# Patient Record
Sex: Male | Born: 1970
Health system: Southern US, Community
[De-identification: ages and names within clinical notes are randomized; demographics above are authoritative.]

---

## 2008-08-04 ENCOUNTER — Emergency Department (HOSPITAL_BASED_OUTPATIENT_CLINIC_OR_DEPARTMENT_OTHER): Admission: EM | Admit: 2008-08-04 | Discharge: 2008-08-04 | Payer: Self-pay | Admitting: Emergency Medicine

## 2016-02-19 ENCOUNTER — Encounter (HOSPITAL_BASED_OUTPATIENT_CLINIC_OR_DEPARTMENT_OTHER): Payer: Self-pay | Admitting: Emergency Medicine

## 2016-02-19 ENCOUNTER — Emergency Department (HOSPITAL_BASED_OUTPATIENT_CLINIC_OR_DEPARTMENT_OTHER)
Admission: EM | Admit: 2016-02-19 | Discharge: 2016-02-19 | Disposition: A | Discharge status: Home / Self Care | Payer: Self-pay | Attending: Emergency Medicine | Admitting: Emergency Medicine

## 2016-02-19 DIAGNOSIS — Y939 Activity, unspecified: Secondary | ICD-10-CM | POA: Insufficient documentation

## 2016-02-19 DIAGNOSIS — Z23 Encounter for immunization: Secondary | ICD-10-CM | POA: Insufficient documentation

## 2016-02-19 DIAGNOSIS — Y999 Unspecified external cause status: Secondary | ICD-10-CM | POA: Insufficient documentation

## 2016-02-19 DIAGNOSIS — W228XXA Striking against or struck by other objects, initial encounter: Secondary | ICD-10-CM | POA: Insufficient documentation

## 2016-02-19 DIAGNOSIS — S0181XA Laceration without foreign body of other part of head, initial encounter: Secondary | ICD-10-CM

## 2016-02-19 DIAGNOSIS — S01511A Laceration without foreign body of lip, initial encounter: Secondary | ICD-10-CM | POA: Insufficient documentation

## 2016-02-19 DIAGNOSIS — Y929 Unspecified place or not applicable: Secondary | ICD-10-CM | POA: Insufficient documentation

## 2016-02-19 MED ORDER — LIDOCAINE HCL (PF) 1 % IJ SOLN
10.0000 mL | Freq: Once | INTRAMUSCULAR | Status: AC
  Administered 2016-02-19: 5 mL

## 2016-02-19 MED ORDER — BACITRACIN ZINC 500 UNIT/GM EX OINT: 1.0000 "application " | TOPICAL_OINTMENT | Freq: Two times a day (BID) | CUTANEOUS | 0 refills | Status: AC

## 2016-02-19 MED ORDER — TETANUS-DIPHTHERIA TOXOIDS TD 5-2 LFU IM INJ: 0.5000 mL | INJECTION | Freq: Once | INTRAMUSCULAR | Status: DC

## 2016-02-19 MED ORDER — LIDOCAINE HCL (PF) 1 % IJ SOLN
INTRAMUSCULAR | Status: AC
  Administered 2016-02-19: 5 mL
  Filled 2016-02-19: qty 10

## 2016-02-19 MED ORDER — TETANUS-DIPHTH-ACELL PERTUSSIS 5-2.5-18.5 LF-MCG/0.5 IM SUSP
0.5000 mL | Freq: Once | INTRAMUSCULAR | Status: AC
  Administered 2016-02-19: 0.5 mL via INTRAMUSCULAR
  Filled 2016-02-19: qty 0.5

## 2016-02-19 MED ORDER — LIDOCAINE-EPINEPHRINE (PF) 2 %-1:200000 IJ SOLN: 10.0000 mL | Freq: Once | INTRAMUSCULAR | Status: DC

## 2016-02-19 NOTE — ED Notes (Signed)
Pt given Rx x 1. Wound care and s/s of infection discussed. Advised to establish care with a PCP and to have b/p rechecked this week. BP readings given to pt for f/u
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
## 2016-02-19 NOTE — Discharge Instructions (Signed)
Your stitches are absorbable so they do not need to be removed. Return to the emergency department if he develops any signs of infection or problems with your wounds. Use half hydrogen peroxide and half water to clean your wounds twice daily. Apply antibiotic ointment twice daily.
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
## 2018-11-21 ENCOUNTER — Other Ambulatory Visit: Payer: Self-pay

## 2018-11-21 ENCOUNTER — Observation Stay (HOSPITAL_BASED_OUTPATIENT_CLINIC_OR_DEPARTMENT_OTHER): Payer: Self-pay

## 2018-11-21 ENCOUNTER — Encounter (HOSPITAL_BASED_OUTPATIENT_CLINIC_OR_DEPARTMENT_OTHER): Payer: Self-pay | Admitting: Emergency Medicine

## 2018-11-21 ENCOUNTER — Emergency Department (HOSPITAL_BASED_OUTPATIENT_CLINIC_OR_DEPARTMENT_OTHER): Payer: Self-pay

## 2018-11-21 ENCOUNTER — Inpatient Hospital Stay (HOSPITAL_BASED_OUTPATIENT_CLINIC_OR_DEPARTMENT_OTHER)
Admission: EM | Admit: 2018-11-21 | Discharge: 2018-11-23 | DRG: 312 | Disposition: A | Discharge status: Home / Self Care | Payer: Self-pay | Attending: Family Medicine | Admitting: Family Medicine

## 2018-11-21 DIAGNOSIS — R7303 Prediabetes: Secondary | ICD-10-CM | POA: Diagnosis present

## 2018-11-21 DIAGNOSIS — M25512 Pain in left shoulder: Secondary | ICD-10-CM | POA: Diagnosis present

## 2018-11-21 DIAGNOSIS — R55 Syncope and collapse: Principal | ICD-10-CM | POA: Diagnosis present

## 2018-11-21 DIAGNOSIS — R6884 Jaw pain: Secondary | ICD-10-CM | POA: Diagnosis present

## 2018-11-21 DIAGNOSIS — Z20828 Contact with and (suspected) exposure to other viral communicable diseases: Secondary | ICD-10-CM | POA: Diagnosis present

## 2018-11-21 DIAGNOSIS — K029 Dental caries, unspecified: Secondary | ICD-10-CM | POA: Diagnosis present

## 2018-11-21 DIAGNOSIS — I10 Essential (primary) hypertension: Secondary | ICD-10-CM | POA: Diagnosis present

## 2018-11-21 DIAGNOSIS — R7989 Other specified abnormal findings of blood chemistry: Secondary | ICD-10-CM | POA: Diagnosis present

## 2018-11-21 DIAGNOSIS — K0889 Other specified disorders of teeth and supporting structures: Secondary | ICD-10-CM | POA: Diagnosis present

## 2018-11-21 DIAGNOSIS — K047 Periapical abscess without sinus: Secondary | ICD-10-CM

## 2018-11-21 DIAGNOSIS — Z803 Family history of malignant neoplasm of breast: Secondary | ICD-10-CM

## 2018-11-21 DIAGNOSIS — R778 Other specified abnormalities of plasma proteins: Secondary | ICD-10-CM | POA: Diagnosis present

## 2018-11-21 DIAGNOSIS — Z79899 Other long term (current) drug therapy: Secondary | ICD-10-CM

## 2018-11-21 DIAGNOSIS — E669 Obesity, unspecified: Secondary | ICD-10-CM | POA: Diagnosis present

## 2018-11-21 DIAGNOSIS — Z6835 Body mass index (BMI) 35.0-35.9, adult: Secondary | ICD-10-CM

## 2018-11-21 DIAGNOSIS — Z833 Family history of diabetes mellitus: Secondary | ICD-10-CM

## 2018-11-21 DIAGNOSIS — R9431 Abnormal electrocardiogram [ECG] [EKG]: Secondary | ICD-10-CM | POA: Diagnosis present

## 2018-11-21 DIAGNOSIS — R110 Nausea: Secondary | ICD-10-CM | POA: Diagnosis present

## 2018-11-21 LAB — CBC WITH DIFFERENTIAL/PLATELET
Abs Immature Granulocytes: 0.06 10*3/uL (ref 0.00–0.07)
Basophils Absolute: 0 10*3/uL (ref 0.0–0.1)
Basophils Relative: 0 %
Eosinophils Absolute: 0 10*3/uL (ref 0.0–0.5)
Eosinophils Relative: 0 %
HCT: 38.6 % — ABNORMAL LOW (ref 39.0–52.0)
Hemoglobin: 11.1 g/dL — ABNORMAL LOW (ref 13.0–17.0)
Immature Granulocytes: 0 %
Lymphocytes Relative: 5 %
Lymphs Abs: 0.7 10*3/uL (ref 0.7–4.0)
MCH: 22.2 pg — ABNORMAL LOW (ref 26.0–34.0)
MCHC: 28.8 g/dL — ABNORMAL LOW (ref 30.0–36.0)
MCV: 77.4 fL — ABNORMAL LOW (ref 80.0–100.0)
Monocytes Absolute: 0.6 10*3/uL (ref 0.1–1.0)
Monocytes Relative: 4 %
Neutro Abs: 14.4 10*3/uL — ABNORMAL HIGH (ref 1.7–7.7)
Neutrophils Relative %: 91 %
Platelets: 276 10*3/uL (ref 150–400)
RBC: 4.99 MIL/uL (ref 4.22–5.81)
RDW: 18.8 % — ABNORMAL HIGH (ref 11.5–15.5)
WBC: 15.8 10*3/uL — ABNORMAL HIGH (ref 4.0–10.5)
nRBC: 0 % (ref 0.0–0.2)

## 2018-11-21 LAB — COMPREHENSIVE METABOLIC PANEL
ALT: 20 U/L (ref 0–44)
AST: 18 U/L (ref 15–41)
Albumin: 3.5 g/dL (ref 3.5–5.0)
Alkaline Phosphatase: 37 U/L — ABNORMAL LOW (ref 38–126)
Anion gap: 8 (ref 5–15)
BUN: 40 mg/dL — ABNORMAL HIGH (ref 6–20)
CO2: 23 mmol/L (ref 22–32)
Calcium: 9.1 mg/dL (ref 8.9–10.3)
Chloride: 107 mmol/L (ref 98–111)
Creatinine, Ser: 0.93 mg/dL (ref 0.61–1.24)
GFR calc Af Amer: >60 mL/min (ref >60)
GFR calc non Af Amer: >60 mL/min (ref >60)
Glucose, Bld: 186 mg/dL — ABNORMAL HIGH (ref 70–99)
Potassium: 4.5 mmol/L (ref 3.5–5.1)
Sodium: 138 mmol/L (ref 135–145)
Total Bilirubin: 0.5 mg/dL (ref 0.3–1.2)
Total Protein: 6.5 g/dL (ref 6.5–8.1)

## 2018-11-21 LAB — CBG MONITORING, ED: Glucose-Capillary: 174 mg/dL — ABNORMAL HIGH (ref 70–99)

## 2018-11-21 LAB — RAPID URINE DRUG SCREEN, HOSP PERFORMED
Amphetamines: NOT DETECTED
Barbiturates: NOT DETECTED
Benzodiazepines: NOT DETECTED
Cocaine: NOT DETECTED
Opiates: NOT DETECTED
Tetrahydrocannabinol: POSITIVE — AB

## 2018-11-21 LAB — TROPONIN I (HIGH SENSITIVITY)
Troponin I (High Sensitivity): 51 ng/L — ABNORMAL HIGH (ref <18)
Troponin I (High Sensitivity): 66 ng/L — ABNORMAL HIGH (ref <18)

## 2018-11-21 LAB — D-DIMER, QUANTITATIVE: D-Dimer, Quant: 0.79 ug/mL-FEU — ABNORMAL HIGH (ref 0.00–0.50)

## 2018-11-21 LAB — LACTIC ACID, PLASMA: Lactic Acid, Venous: 1.5 mmol/L (ref 0.5–1.9)

## 2018-11-21 MED ORDER — SODIUM CHLORIDE 0.9 % IV SOLN
INTRAVENOUS | Status: DC | PRN
  Administered 2018-11-21: 1000 mL via INTRAVENOUS

## 2018-11-21 MED ORDER — SODIUM CHLORIDE 0.9 % IV SOLN: Freq: Once | INTRAVENOUS | Status: DC

## 2018-11-21 MED ORDER — SODIUM CHLORIDE 0.9 % IV BOLUS: 1000.0000 mL | Freq: Once | INTRAVENOUS | Status: DC

## 2018-11-21 MED ORDER — SODIUM CHLORIDE 0.9 % IV BOLUS
1000.0000 mL | Freq: Once | INTRAVENOUS | Status: AC
  Administered 2018-11-21: 19:00:00 1000 mL via INTRAVENOUS

## 2018-11-21 MED ORDER — ASPIRIN 81 MG PO CHEW
324.0000 mg | CHEWABLE_TABLET | Freq: Once | ORAL | Status: AC
  Administered 2018-11-21: 324 mg via ORAL
  Filled 2018-11-21: qty 4

## 2018-11-21 MED ORDER — IOHEXOL 350 MG/ML SOLN
75.0000 mL | Freq: Once | INTRAVENOUS | Status: AC | PRN
  Administered 2018-11-21: 20:00:00 via INTRAVENOUS

## 2018-11-21 MED ORDER — CLINDAMYCIN PHOSPHATE 600 MG/50ML IV SOLN
INTRAVENOUS | Status: AC
  Filled 2018-11-21: qty 50

## 2018-11-21 MED ORDER — DEXTROSE 5 % IV SOLN
450.0000 mg | Freq: Once | INTRAVENOUS | Status: DC
  Filled 2018-11-21: qty 3

## 2018-11-21 MED ORDER — CLINDAMYCIN PHOSPHATE 300 MG/50ML IV SOLN
300.0000 mg | Freq: Once | INTRAVENOUS | Status: AC
  Administered 2018-11-21: 300 mg via INTRAVENOUS
  Filled 2018-11-21: qty 50

## 2018-11-21 NOTE — ED Notes (Signed)
ED Provider at bedside. 

## 2018-11-21 NOTE — ED Notes (Signed)
Received report

## 2018-11-21 NOTE — ED Triage Notes (Signed)
Pt reports left upper tooth pain x 4 days that pt states resolved this am. Pt reports taking 1g amoxicillin x 4 days from prior script and pt now c/o left side facial redness and swelling. Pt denies sob, denies fever

## 2018-11-21 NOTE — ED Notes (Signed)
[facility] notified (Tammy) - patient ready for transport 

## 2018-11-21 NOTE — ED Notes (Signed)
cbg 174 ° °

## 2018-11-21 NOTE — ED Notes (Signed)
Spoke with Carol in lab to add on trop 

## 2018-11-21 NOTE — ED Notes (Addendum)
Pt reports feeling lightheaded and diaphoretic x 1 hr pta. Pt states significant other states pt "passed out". Pt denies HA. Pt also reports 2 episodes of emesis. Pt was seen by EMS and advised to report to ED

## 2018-11-21 NOTE — ED Provider Notes (Signed)
[facility] EM[facility]GENCY DEPARTMENT Provider Note   CSN: [id number] Arrival date & time: 11/21/18  1741     History   Chief Complaint Chief Complaint  Patient presents with   Loss of Consciousness   Facial Swelling    HPI Carl Barron is a 48 y.o. male with no significant past medical history who presents today for evaluation of 2 complaints. 1.  Dental pain: He reports that over the past 4 days he has had pain in the left upper side of his face from a tooth.  He states that he has been treating this at home with leftover amoxicillin and Keflex.  He reports that today when he woke up he had swelling on the left side of his face that over the course of the day has progressed from a quarter sized area up to swelling from the lower lid down.  He denies any swelling below the mandible.  No changes to phonation, difficulty swallowing, or feelings of intraoral swelling.  He attempted to see a dentist today however they reported that they were unable to see him.  2.  Syncope: According to patient and his girlfriend today patient got up to brush his teeth to go to get medical care when he started feeling poorly.  He called her into the room when he had a 1 to 2-second syncopal event.  This was witnessed by his girlfriend who states that he did not strike his head.  He did not have any seizure-like activity.  He denies any chest pain, shortness of breath.  He did vomit 2 times around when he passed out.  While in the emergency room, shortly after his IV start he again started to feel like he was going to pass out.  He was nauseous however did not vomit.  He continued to denied any pain in his chest, abdomen or anywhere other than related to his tooth.  He denied any shortness of breath.  This resolved without specific intervention.  During this he was diaphoretic and reported feeling lightheaded.  He states that he has not had blood drawn in 20 years.     HPI  History reviewed. No  pertinent past medical history.  Patient Active Problem List   Diagnosis Date Noted   Abnormal EKG 11/21/2018    History reviewed. No pertinent surgical history.      Home Medications    Prior to Admission medications   Medication Sig Start Date End Date Taking? Authorizing Provider  bacitracin ointment Apply 1 application topically 2 (two) times daily. 02/19/16   Charlesetta Shanks, MD    Family History History reviewed. No pertinent family history.  Social History Social History   Tobacco Use   Smoking status: Never Smoker   Smokeless tobacco: Never Used  Substance Use Topics   Alcohol use: Never    Frequency: Never   Drug use: Never     Allergies   Patient has no known allergies.   Review of Systems Review of Systems  Constitutional: Positive for diaphoresis and fatigue. Negative for chills and fever.  HENT: Positive for dental problem and facial swelling. Negative for congestion, drooling, rhinorrhea, sinus pain, sore throat, trouble swallowing and voice change.   Respiratory: Negative for cough and chest tightness.   Cardiovascular: Negative for chest pain and palpitations.  Gastrointestinal: Positive for nausea. Negative for abdominal pain, anal bleeding, diarrhea and vomiting.  Genitourinary: Negative for dysuria.  Musculoskeletal: Negative for back pain and neck pain.  Skin: Negative for  color change and rash.  Neurological: Positive for syncope. Negative for speech difficulty.  Psychiatric/Behavioral: Negative for confusion.  All other systems reviewed and are negative.    Physical Exam Updated Vital Signs BP (!) 158/98    Pulse (!) 108    Temp 98.5 F (36.9 C) (Oral)    Resp (!) 21    Ht  (1.676 m)    Wt 101.2 kg    SpO2 100%    BMI 35.99 kg/m   Physical Exam Vitals signs and nursing note reviewed.  Constitutional:      Appearance: He is well-developed.     Comments: On initial evaluation he was diaphoretic, this resolved while I was in  the room.  HENT:     Head: Atraumatic.     Comments: Mild edema present over the left-sided face.  There is no appreciable swelling under the mandible.  No elevation of the floor of the mouth.  There is no visualized intraoral abscess or swelling.    Mouth/Throat:     Mouth: Mucous membranes are moist.     Pharynx: Oropharynx is clear. No oropharyngeal exudate or posterior oropharyngeal erythema.     Comments: Poor state of dentition with missing/carious teeth. Eyes:     Conjunctiva/sclera: Conjunctivae normal.  Neck:     Musculoskeletal: Normal range of motion and neck supple. No neck rigidity or muscular tenderness.  Cardiovascular:     Rate and Rhythm: Regular rhythm. Tachycardia present.     Pulses: Normal pulses.     Heart sounds: Normal heart sounds. No murmur.  Pulmonary:     Effort: Pulmonary effort is normal. No respiratory distress.     Breath sounds: Normal breath sounds. No stridor.  Abdominal:     General: Abdomen is flat. There is no distension.     Palpations: Abdomen is soft.     Tenderness: There is no abdominal tenderness.  Musculoskeletal: Normal range of motion.  Lymphadenopathy:     Cervical: No cervical adenopathy.  Skin:    General: Skin is warm and dry.  Neurological:     General: No focal deficit present.     Mental Status: He is alert.  Psychiatric:        Mood and Affect: Mood normal.        Behavior: Behavior normal.      ED Treatments / Results  Labs (all labs ordered are listed, but only abnormal results are displayed) Labs Reviewed  COMPREHENSIVE METABOLIC PANEL - Abnormal; Notable for the following components:      Result Value   Glucose, Bld 186 (*)    BUN 40 (*)    Alkaline Phosphatase 37 (*)    All other components within normal limits  CBC WITH DIFFERENTIAL/PLATELET - Abnormal; Notable for the following components:   WBC 15.8 (*)    Hemoglobin 11.1 (*)    HCT 38.6 (*)    MCV 77.4 (*)    MCH 22.2 (*)    MCHC 28.8 (*)    RDW  18.8 (*)    Neutro Abs 14.4 (*)    All other components within normal limits  D-DIMER, QUANTITATIVE (NOT AT [facility]) - Abnormal; Notable for the following components:   D-Dimer, Quant 0.79 (*)    All other components within normal limits  RAPID URINE DRUG SCREEN, HOSP PERFORMED - Abnormal; Notable for the following components:   Tetrahydrocannabinol POSITIVE (*)    All other components within normal limits  CBG MONITORING, ED - Abnormal; Notable  for the following components:   Glucose-Capillary 174 (*)    All other components within normal limits  TROPONIN I (HIGH SENSITIVITY) - Abnormal; Notable for the following components:   Troponin I (High Sensitivity) 51 (*)    All other components within normal limits  TROPONIN I (HIGH SENSITIVITY) - Abnormal; Notable for the following components:   Troponin I (High Sensitivity) 66 (*)    All other components within normal limits  CULTURE, BLOOD (ROUTINE X 2)  CULTURE, BLOOD (ROUTINE X 2)  SARS CORONAVIRUS 2 (TAT 6-24 HRS)  LACTIC ACID, PLASMA  LACTIC ACID, PLASMA    EKG EKG Interpretation  Date/Time:  Saturday November 21 2018 18:27:15 EDT Ventricular Rate:  97 PR Interval:    QRS Duration: 86 QT Interval:  338 QTC Calculation: 430 R Axis:   32 Text Interpretation: Sinus rhythm T wave inversion in inferior leads, same as prior ecg on 11/21/2018 Confirmed by Marianna Fussykstra, Richard ([id number]) on 11/21/2018 6:41:38 PM   EKG Interpretation  Date/Time:  Saturday November 21 2018 19:21:43 EDT Ventricular Rate:  105 PR Interval:    QRS Duration: 89 QT Interval:  328 QTC Calculation: 434 R Axis:   55 Text Interpretation: Sinus tachycardia T wave inversions unchanged from prior ecg no acute STEMI Confirmed by Marianna Fussykstra, Richard ([id number]) on 11/21/2018 7:42:11 PM        Radiology Ct Head Wo Contrast  Result Date: 11/21/2018 CLINICAL DATA:  Facial swelling. EXAM: CT HEAD WITHOUT CONTRAST CT MAXILLOFACIAL WITHOUT CONTRAST TECHNIQUE: Multidetector CT  imaging of the head and maxillofacial structures were performed using the standard protocol without intravenous contrast. Multiplanar CT image reconstructions of the maxillofacial structures were also generated. COMPARISON:  None. FINDINGS: CT HEAD FINDINGS Brain: No evidence of acute infarction, hemorrhage, hydrocephalus, extra-axial collection or mass lesion/mass effect. Vascular: No hyperdense vessel or unexpected calcification. Skull: Normal. Negative for fracture or focal lesion. Other: None. CT MAXILLOFACIAL FINDINGS Osseous: No fracture or mandibular dislocation. No destructive process. Orbits: Negative. No traumatic or inflammatory finding. Sinuses: There is mild bilateral maxillary mucosal thickening. The rest of the paranasal sinuses and mastoid air cells are essentially clear. Soft tissues: There is left pre maxillary soft tissue swelling. There is no abscess. Multiple dental caries are noted. IMPRESSION: 1. No acute intracranial abnormality. 2. There is left-sided pre maxillary soft tissue swelling without evidence for an associated abscess. 3. Multiple dental caries are noted. Electronically Signed   By: Katherine Mantlehristopher  Green M.D.   On: 11/21/2018 22:19   Ct Angio Chest Pe W/cm &/or Wo Cm  Result Date: 11/21/2018 CLINICAL DATA:  Suspected PE. Positive D-dimer. Syncope. Tachycardia. EXAM: CT ANGIOGRAPHY CHEST WITH CONTRAST TECHNIQUE: Multidetector CT imaging of the chest was performed using the standard protocol during bolus administration of intravenous contrast. Multiplanar CT image reconstructions and MIPs were obtained to evaluate the vascular anatomy. CONTRAST:  75 mL of Omnipaque 350 COMPARISON:  None. FINDINGS: Cardiovascular: The thoracic aorta is normal without aneurysm or dissection. No atherosclerotic change identified. The heart size is unremarkable. No obvious coronary artery calcifications are identified. Evaluation of pulmonary arteries is somewhat limited due to respiratory motion.  Within these limitations, no pulmonary emboli identified. Mediastinum/Nodes: No enlarged mediastinal, hilar, or axillary lymph nodes. Thyroid gland, trachea, and esophagus demonstrate no significant findings. Lungs/Pleura: Central airways are normal. No pneumothorax. No nodules, masses, or focal infiltrates. Upper Abdomen: No acute abnormality. Musculoskeletal: No chest wall abnormality. No acute or significant osseous findings. Review of the MIP images confirms the above findings. IMPRESSION:  1. Evaluation for pulmonary emboli is somewhat limited due to respiratory motion. Within these limitations, no central emboli identified. 2. No other acute abnormalities are noted. Electronically Signed   By: Gerome Sam III M.D   On: 11/21/2018 20:56   Ct Maxillofacial Wo Cm  Result Date: 11/21/2018 CLINICAL DATA:  Facial swelling. EXAM: CT HEAD WITHOUT CONTRAST CT MAXILLOFACIAL WITHOUT CONTRAST TECHNIQUE: Multidetector CT imaging of the head and maxillofacial structures were performed using the standard protocol without intravenous contrast. Multiplanar CT image reconstructions of the maxillofacial structures were also generated. COMPARISON:  None. FINDINGS: CT HEAD FINDINGS Brain: No evidence of acute infarction, hemorrhage, hydrocephalus, extra-axial collection or mass lesion/mass effect. Vascular: No hyperdense vessel or unexpected calcification. Skull: Normal. Negative for fracture or focal lesion. Other: None. CT MAXILLOFACIAL FINDINGS Osseous: No fracture or mandibular dislocation. No destructive process. Orbits: Negative. No traumatic or inflammatory finding. Sinuses: There is mild bilateral maxillary mucosal thickening. The rest of the paranasal sinuses and mastoid air cells are essentially clear. Soft tissues: There is left pre maxillary soft tissue swelling. There is no abscess. Multiple dental caries are noted. IMPRESSION: 1. No acute intracranial abnormality. 2. There is left-sided pre maxillary soft  tissue swelling without evidence for an associated abscess. 3. Multiple dental caries are noted. Electronically Signed   By: Katherine Mantle M.D.   On: 11/21/2018 22:19    Procedures Procedures (including critical care time)  Medications Ordered in ED Medications  clindamycin (CLEOCIN) IVPB 300 mg (300 mg Intravenous New Bag/Given 11/21/18 2243)  0.9 %  sodium chloride infusion ( Intravenous Stopped 11/21/18 2242)  sodium chloride 0.9 % bolus 1,000 mL (0 mLs Intravenous Stopped 11/21/18 2141)  iohexol (OMNIPAQUE) 350 MG/ML injection 75 mL ( Intravenous Contrast Given 11/21/18 2020)  aspirin chewable tablet 324 mg (324 mg Oral Given 11/21/18 2234)     Initial Impression / Assessment and Plan / ED Course  I have reviewed the triage vital signs and the nursing notes.  Pertinent labs & imaging results that were available during my care of the patient were reviewed by me and considered in my medical decision making (see chart for details).  Clinical Course as of Nov 20 2298  Sat Nov 21, 2018  1908 Troponin I (High Sensitivity)(!): 51 [EH]  [phone] Updated patient and girl friend on plan.    [EH]    Clinical Course User Index [EH] Cristina Gong, PA-C      Sharalyn Ink presents today for evaluation of multiple complaints. 1.  Syncope: He reportedly had a syncopal episode at home followed by a presyncopal episode here in the emergency room.  He is not having any chest pain or shortness of breath, however with syncope EKG was obtained showing multiple T wave inversions.  Troponin was obtained and was elevated at 51.  Patient is not having chest pain or shortness of breath.  He was consistently tachycardic while in the emergency room with heart rates between 100-110.  D-dimer was elevated.  CTA PE study was obtained without evidence of PE or other acute intrathoracic abnormalities.  He was observed on cardiac monitoring without significant arrhythmia.  He does not have a primary care  doctor or any history of cardiac problems.    2. Dental infection: He has had 4 days of dental pain.  He has been treating this at home with leftover amoxicillin and Keflex however today woke up with facial swelling.  Patient is tachycardic.  Labs obtained and reviewed, He has a leukocytosis  at 15.8.  Blood cultures were ordered.  His lactic acid is not elevated and he is afebrile.  He was started on IV clindamycin and given a fluid bolus.  He did not require 30/kg fluid bolus as his lactic was not elevated over 4 and he was not hypotensive.  After discussion with hospitalist who requested CT head and max face for further evaluation of the possible abscess.  Coronavirus testing was sent.  Given elevated troponin without prior for comparison and multiple T wave inversions will admit patient to [facility] for rule out ACS.  Patient remained hemodynamically stable while in my care.  This patient was seen as a shared visit with Dr. Stevie Kern.  I spoke with Dr. Debby Bud who agreed to admit the patient.  Patient will be transported by [facility].  Final Clinical Impressions(s) / ED Diagnoses   Final diagnoses:  Elevated troponin  Syncope and collapse  Dental infection    ED Discharge Orders    None       Norman Clay 11/21/18 2311    Milagros Loll, MD 11/23/18 1247

## 2018-11-22 ENCOUNTER — Encounter (HOSPITAL_COMMUNITY): Payer: Self-pay | Admitting: Internal Medicine

## 2018-11-22 DIAGNOSIS — R9431 Abnormal electrocardiogram [ECG] [EKG]: Secondary | ICD-10-CM

## 2018-11-22 DIAGNOSIS — R778 Other specified abnormalities of plasma proteins: Secondary | ICD-10-CM

## 2018-11-22 DIAGNOSIS — K047 Periapical abscess without sinus: Secondary | ICD-10-CM | POA: Diagnosis present

## 2018-11-22 DIAGNOSIS — R55 Syncope and collapse: Secondary | ICD-10-CM | POA: Diagnosis present

## 2018-11-22 LAB — LIPID PANEL
Cholesterol: 140 mg/dL (ref 0–200)
HDL: 44 mg/dL (ref >40)
LDL Cholesterol: 76 mg/dL (ref 0–99)
Total CHOL/HDL Ratio: 3.2 RATIO
Triglycerides: 99 mg/dL (ref <150)
VLDL: 20 mg/dL (ref 0–40)

## 2018-11-22 LAB — CBC WITH DIFFERENTIAL/PLATELET
Abs Immature Granulocytes: 0.06 10*3/uL (ref 0.00–0.07)
Basophils Absolute: 0.1 10*3/uL (ref 0.0–0.1)
Basophils Relative: 1 %
Eosinophils Absolute: 0.4 10*3/uL (ref 0.0–0.5)
Eosinophils Relative: 2 %
HCT: 34 % — ABNORMAL LOW (ref 39.0–52.0)
Hemoglobin: 10.2 g/dL — ABNORMAL LOW (ref 13.0–17.0)
Immature Granulocytes: 0 %
Lymphocytes Relative: 17 %
Lymphs Abs: 2.7 10*3/uL (ref 0.7–4.0)
MCH: 22.7 pg — ABNORMAL LOW (ref 26.0–34.0)
MCHC: 30 g/dL (ref 30.0–36.0)
MCV: 75.7 fL — ABNORMAL LOW (ref 80.0–100.0)
Monocytes Absolute: 1.4 10*3/uL — ABNORMAL HIGH (ref 0.1–1.0)
Monocytes Relative: 9 %
Neutro Abs: 10.9 10*3/uL — ABNORMAL HIGH (ref 1.7–7.7)
Neutrophils Relative %: 71 %
Platelets: 262 10*3/uL (ref 150–400)
RBC: 4.49 MIL/uL (ref 4.22–5.81)
RDW: 18.8 % — ABNORMAL HIGH (ref 11.5–15.5)
WBC: 15.5 10*3/uL — ABNORMAL HIGH (ref 4.0–10.5)
nRBC: 0 % (ref 0.0–0.2)

## 2018-11-22 LAB — SARS CORONAVIRUS 2 (TAT 6-24 HRS): SARS Coronavirus 2: NEGATIVE

## 2018-11-22 LAB — CK TOTAL AND CKMB (NOT AT ARMC)
CK, MB: 2.3 ng/mL (ref 0.5–5.0)
CK, MB: 1.8 ng/mL (ref 0.5–5.0)
Relative Index: 2 (ref 0.0–2.5)
Relative Index: 1.6 (ref 0.0–2.5)
Total CK: 117 U/L (ref 49–397)
Total CK: 111 U/L (ref 49–397)

## 2018-11-22 LAB — HEMOGLOBIN A1C
Hgb A1c MFr Bld: 6.4 % — ABNORMAL HIGH (ref 4.8–5.6)
Mean Plasma Glucose: 136.98 mg/dL

## 2018-11-22 LAB — GLUCOSE, CAPILLARY
Glucose-Capillary: 98 mg/dL (ref 70–99)
Glucose-Capillary: 109 mg/dL — ABNORMAL HIGH (ref 70–99)
Glucose-Capillary: 109 mg/dL — ABNORMAL HIGH (ref 70–99)

## 2018-11-22 LAB — TROPONIN I (HIGH SENSITIVITY)
Troponin I (High Sensitivity): 24 ng/L — ABNORMAL HIGH (ref <18)
Troponin I (High Sensitivity): 21 ng/L — ABNORMAL HIGH (ref <18)

## 2018-11-22 LAB — HIV ANTIBODY (ROUTINE TESTING W REFLEX): HIV Screen 4th Generation wRfx: NONREACTIVE

## 2018-11-22 MED ORDER — SODIUM CHLORIDE 0.9% FLUSH: 3.0000 mL | INTRAVENOUS | Status: DC | PRN

## 2018-11-22 MED ORDER — INSULIN ASPART 100 UNIT/ML ~~LOC~~ SOLN: 0.0000 [IU] | Freq: Every day | SUBCUTANEOUS | Status: DC

## 2018-11-22 MED ORDER — ACETAMINOPHEN 500 MG PO TABS
ORAL_TABLET | ORAL | Status: AC
  Administered 2018-11-22: 01:00:00 via ORAL
  Filled 2018-11-22: qty 2

## 2018-11-22 MED ORDER — SODIUM CHLORIDE 0.9% FLUSH
3.0000 mL | Freq: Two times a day (BID) | INTRAVENOUS | Status: DC
  Administered 2018-11-23: 3 mL via INTRAVENOUS

## 2018-11-22 MED ORDER — ASPIRIN EC 81 MG PO TBEC
81.0000 mg | DELAYED_RELEASE_TABLET | Freq: Every day | ORAL | Status: DC
  Administered 2018-11-22 – 2018-11-23 (×2): 81 mg via ORAL
  Filled 2018-11-22 (×3): qty 1

## 2018-11-22 MED ORDER — SODIUM CHLORIDE 0.9% FLUSH
3.0000 mL | Freq: Two times a day (BID) | INTRAVENOUS | Status: DC
  Administered 2018-11-22 – 2018-11-23 (×3): 3 mL via INTRAVENOUS

## 2018-11-22 MED ORDER — ACETAMINOPHEN 500 MG PO TABS
1000.0000 mg | ORAL_TABLET | Freq: Once | ORAL | Status: AC
  Administered 2018-11-22: 01:00:00 via ORAL

## 2018-11-22 MED ORDER — AMOXICILLIN-POT CLAVULANATE 875-125 MG PO TABS
1.0000 | ORAL_TABLET | Freq: Two times a day (BID) | ORAL | Status: AC
  Administered 2018-11-22 (×2): 1 via ORAL
  Filled 2018-11-22 (×2): qty 1

## 2018-11-22 MED ORDER — INSULIN ASPART 100 UNIT/ML ~~LOC~~ SOLN: 0.0000 [IU] | Freq: Three times a day (TID) | SUBCUTANEOUS | Status: DC

## 2018-11-22 MED ORDER — ENOXAPARIN SODIUM 40 MG/0.4ML ~~LOC~~ SOLN: 40.0000 mg | SUBCUTANEOUS | Status: DC

## 2018-11-22 MED ORDER — SODIUM CHLORIDE 0.9 % IV SOLN: 250.0000 mL | INTRAVENOUS | Status: DC | PRN

## 2018-11-22 MED ORDER — ACETAMINOPHEN 650 MG RE SUPP: 650.0000 mg | Freq: Four times a day (QID) | RECTAL | Status: DC | PRN

## 2018-11-22 MED ORDER — TRAMADOL HCL 50 MG PO TABS: 50.0000 mg | ORAL_TABLET | Freq: Four times a day (QID) | ORAL | Status: DC | PRN

## 2018-11-22 MED ORDER — ACETAMINOPHEN 325 MG PO TABS: 650.0000 mg | ORAL_TABLET | Freq: Four times a day (QID) | ORAL | Status: DC | PRN

## 2018-11-22 NOTE — ED Notes (Signed)
Verbal order for tylenol given during downtime d/t facial pain/swelling. See eMAR.

## 2018-11-22 NOTE — Progress Notes (Signed)
md round in room, new order noted

## 2018-11-22 NOTE — Plan of Care (Signed)

## 2018-11-22 NOTE — Progress Notes (Signed)
Pt arrived in room via ambulace stretcher, refused any pain, b/p elevate, rest in bed, notify provider, follow up new order, orient pt to room equipment and care plan, verbalized understanding,

## 2018-11-22 NOTE — H&P (Signed)
History and Physical    Carl Barron ZOX:[id number] DOB: Mar 22, 1970 DOA: 11/21/2018  PCP: Patient, No Pcp Per (Confirm with patient/family/NH records and if not entered, this has to be entered at [facility] point of entry) Patient coming from: Transfer from [facility], to [facility] from home  I have personally briefly reviewed patient's old medical records in [facility]  Chief Complaint: Shoulder pain  HPI: Carl Barron is a 48 y.o. male with medical history significant of good health with no prior medical problems.  Patient's had facial pain for several days in the left axilla and maxillary sinus.  He has had dental problems in the past.  The pain has been unremitting.  He denies any rigors or fevers.  Because of a presumed dental infection he presented to his [facility] ED seeking antibiotic therapy.  Carl Barron had had an episode of syncope witnessed by his wife.  ED Course: Patient was hemodynamically stable.  He had a second episode of syncope.  There was a rapid recovery.  There were no postictal symptoms.  Routine laboratory was drawn revealing a leukocytosis.  EKG performed revealed T wave inversions on 3 serial studies.  He had a flat T wave in lead I to aVF V4 and V6.  No other abnormalities were noted.  Patient did have troponin performed and came back elevated: #1 51, #2 66.  Patient has CT angio rule out PE which was negative.  Lungs were clear.  CT of the head and face was negative except for some premaxillary sinus swelling.  There was no abscess.  Patient was referred to [facility] for admission secondary to abnormal EKG and elevated troponins.  Review of Systems: As per HPI otherwise 10 point review of systems negative.  Patient admits to chronic rest of breath with exertion that has not been changed over the last several weeks   History reviewed. No pertinent past medical history.  History reviewed. No pertinent surgical history.   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  She has been married for 15 years.  He has no children.  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  They have their own home.   reports that he has never smoked. He has never used smokeless tobacco. He reports that he does not drink alcohol or use drugs.  No Known Allergies  Family History  Problem Relation Age of Onset   Breast cancer Mother    Diabetes Father      Prior to Admission medications   Medication Sig Start Date End Date Taking? Authorizing Provider  bacitracin ointment Apply 1 application topically 2 (two) times daily. 02/19/16   Arby Barrette, MD    Physical Exam: Vitals:   11/22/18 0100 11/22/18 0130 11/22/18 0300 11/22/18 0416  BP: (!) 142/91 128/75 114/71 (!) 158/107  Pulse: 93 95 92 94  Resp: Temp:    99 F (37.2 C)  TempSrc:    Oral  SpO2: 97% 95% 96% 100%  Weight:      Height:        Constitutional: NAD, calm, comfortable Vitals:   11/22/18 0100 11/22/18 0130 11/22/18 0300 11/22/18 0416  BP: (!) 142/91 128/75 114/71 (!) 158/107  Pulse: 93 95 92 94  Resp: Temp:    99 F (37.2 C)  TempSrc:    Oral  SpO2: 97% 95% 96% 100%  Weight:      Height:       General appearance: Overweight male in no distress  eyes: PERRL, lids and conjunctivae normal ENMT: Mucous membranes are moist. Posterior pharynx clear of any exudate or lesions.  No gingivitis.  Question of a tender molar.  Tender to palpation to the over the left maxillary sinus.  Neck: normal, supple, no masses, no thyromegaly Respiratory: clear to auscultation bilaterally, no wheezing, no crackles. Normal respiratory effort. No accessory muscle use.  Cardiovascular: Regular rate and rhythm, no murmurs / rubs / gallops. No extremity edema. 2+ pedal pulses. No carotid bruits.  Abdomen: Obese ,no tenderness, no masses palpated. No hepatosplenomegaly. Bowel sounds positive.  Musculoskeletal: no clubbing / cyanosis. No joint  deformity upper and lower extremities. Good ROM, no contractures. Normal muscle tone.  Skin: no rashes, lesions, ulcers. No induration Neurologic: CN 2-12 grossly intact. Sensation intact, DTR normal. Strength 5/5 in all 4.  Psychiatric: Normal judgment and insight. Alert and oriented x 3. Normal mood.     Labs on Admission: I have personally reviewed following labs and imaging studies  CBC: Recent Labs  Lab 11/21/18 1821  WBC 15.8*  NEUTROABS 14.4*  HGB 11.1*  HCT 38.6*  MCV 77.4*  PLT 276   Basic Metabolic Panel: Recent Labs  Lab 11/21/18 1821  NA 138  K 4.5  CL 107  CO2 23  GLUCOSE 186*  BUN 40*  CREATININE 0.93  CALCIUM 9.1   GFR: Estimated Creatinine Clearance: 110.6 mL/min (by C-G formula based on SCr of 0.93 mg/dL). Liver Function Tests: Recent Labs  Lab 11/21/18 1821  AST 18  ALT 20  ALKPHOS 37*  BILITOT 0.5  PROT 6.5  ALBUMIN 3.5   No results for input(s): LIPASE, AMYLASE in the last 168 hours. No results for input(s): AMMONIA in the last 168 hours. Coagulation Profile: No results for input(s): INR, PROTIME in the last 168 hours. Cardiac Enzymes: No results for input(s): CKTOTAL, CKMB, CKMBINDEX, TROPONINI in the last 168 hours. BNP (last 3 results) No results for input(s): PROBNP in the last 8760 hours. HbA1C: No results for input(s): HGBA1C in the last 72 hours. CBG: Recent Labs  Lab 11/21/18 1819  GLUCAP 174*   Lipid Profile: No results for input(s): CHOL, HDL, LDLCALC, TRIG, CHOLHDL, LDLDIRECT in the last 72 hours. Thyroid Function Tests: No results for input(s): TSH, T4TOTAL, FREET4, T3FREE, THYROIDAB in the last 72 hours. Anemia Panel: No results for input(s): VITAMINB12, FOLATE, FERRITIN, TIBC, IRON, RETICCTPCT in the last 72 hours. Urine analysis: No results found for: COLORURINE, APPEARANCEUR, LABSPEC, PHURINE, GLUCOSEU, HGBUR, BILIRUBINUR, KETONESUR, PROTEINUR, UROBILINOGEN, NITRITE, LEUKOCYTESUR  Radiological Exams on  Admission: Ct Head Wo Contrast  Result Date: 11/21/2018 CLINICAL DATA:  Facial swelling. EXAM: CT HEAD WITHOUT CONTRAST CT MAXILLOFACIAL WITHOUT CONTRAST TECHNIQUE: Multidetector CT imaging of the head and maxillofacial structures were performed using the standard protocol without intravenous contrast. Multiplanar CT image reconstructions of the maxillofacial structures were also generated. COMPARISON:  None. FINDINGS: CT HEAD FINDINGS Brain: No evidence of acute infarction, hemorrhage, hydrocephalus, extra-axial collection or mass lesion/mass effect. Vascular: No hyperdense vessel or unexpected calcification. Skull: Normal. Negative for fracture or focal lesion. Other: None. CT MAXILLOFACIAL FINDINGS Osseous: No fracture or mandibular dislocation. No destructive process. Orbits: Negative. No traumatic or inflammatory finding. Sinuses: There is mild bilateral maxillary mucosal thickening. The rest of the paranasal sinuses and mastoid air cells are essentially clear. Soft tissues: There is left pre maxillary soft tissue swelling. There is  no abscess. Multiple dental caries are noted. IMPRESSION: 1. No acute intracranial abnormality. 2. There is left-sided pre maxillary soft tissue swelling without evidence for an associated abscess. 3. Multiple dental caries are noted. Electronically Signed   By: Katherine Mantle M.D.   On: 11/21/2018 22:19   Ct Angio Chest Pe W/cm &/or Wo Cm  Result Date: 11/21/2018 CLINICAL DATA:  Suspected PE. Positive D-dimer. Syncope. Tachycardia. EXAM: CT ANGIOGRAPHY CHEST WITH CONTRAST TECHNIQUE: Multidetector CT imaging of the chest was performed using the standard protocol during bolus administration of intravenous contrast. Multiplanar CT image reconstructions and MIPs were obtained to evaluate the vascular anatomy. CONTRAST:  75 mL of Omnipaque 350 COMPARISON:  None. FINDINGS: Cardiovascular: The thoracic aorta is normal without aneurysm or dissection. No atherosclerotic change  identified. The heart size is unremarkable. No obvious coronary artery calcifications are identified. Evaluation of pulmonary arteries is somewhat limited due to respiratory motion. Within these limitations, no pulmonary emboli identified. Mediastinum/Nodes: No enlarged mediastinal, hilar, or axillary lymph nodes. Thyroid gland, trachea, and esophagus demonstrate no significant findings. Lungs/Pleura: Central airways are normal. No pneumothorax. No nodules, masses, or focal infiltrates. Upper Abdomen: No acute abnormality. Musculoskeletal: No chest wall abnormality. No acute or significant osseous findings. Review of the MIP images confirms the above findings. IMPRESSION: 1. Evaluation for pulmonary emboli is somewhat limited due to respiratory motion. Within these limitations, no central emboli identified. 2. No other acute abnormalities are noted. Electronically Signed   By: Gerome Sam III M.D   On: 11/21/2018 20:56   Ct Maxillofacial Wo Cm  Result Date: 11/21/2018 CLINICAL DATA:  Facial swelling. EXAM: CT HEAD WITHOUT CONTRAST CT MAXILLOFACIAL WITHOUT CONTRAST TECHNIQUE: Multidetector CT imaging of the head and maxillofacial structures were performed using the standard protocol without intravenous contrast. Multiplanar CT image reconstructions of the maxillofacial structures were also generated. COMPARISON:  None. FINDINGS: CT HEAD FINDINGS Brain: No evidence of acute infarction, hemorrhage, hydrocephalus, extra-axial collection or mass lesion/mass effect. Vascular: No hyperdense vessel or unexpected calcification. Skull: Normal. Negative for fracture or focal lesion. Other: None. CT MAXILLOFACIAL FINDINGS Osseous: No fracture or mandibular dislocation. No destructive process. Orbits: Negative. No traumatic or inflammatory finding. Sinuses: There is mild bilateral maxillary mucosal thickening. The rest of the paranasal sinuses and mastoid air cells are essentially clear. Soft tissues: There is left pre  maxillary soft tissue swelling. There is no abscess. Multiple dental caries are noted. IMPRESSION: 1. No acute intracranial abnormality. 2. There is left-sided pre maxillary soft tissue swelling without evidence for an associated abscess. 3. Multiple dental caries are noted. Electronically Signed   By: Katherine Mantle M.D.   On: 11/21/2018 22:19    EKG: Independently reviewed.  3 EKGs and series all revealing wave inversion in leads I to aVF V4 and V6.  No other acute changes noted  Assessment/Plan Active Problems:   Abnormal EKG   Syncope and collapse   Elevated troponin   Dental infection     1. elevated troponin -patient with multiple risk factors including gender, obesity, elevated serum glucose.  Lipids have never been checked.  Family history is negative.  He is in poor cardiovascular condition with shortness of breath with mild exertion.  EKG with T wave inversion. Plan observation admission on telemetry  CK-MB every 12 hours x3  Repeat EKG in 18 hours after admission plus EKG for any symptoms  Lipid panel ordered and pending  Further recommendations be based on lab results and repeat EKG  2.  Syncope -  patient with a normal examination.  Suspect this was a vasovagal episode secondary to stress and blood draw.  CT head was negative.  no further evaluation at this time  3.  Elevated serum glucose -patient does admit to increased thirst, increased fluid intake, increased frequency of urination.  Admits to a high carb diet as well as a lack of exercise.  Patient at high risk for being diabetic. Plan A1c drawn and pending.  Recommendations based on results.  4.  Dental -patient with a dental infection with an elevated white blood count.  CT scan was negative for abscess positive for soft tissue swelling in the area of left maxillary sinus.  Patient was given clindamycin IV at [facility] Plan Augmentin 875mg   twice daily  Patient is to keep dental appointment on Tuesday, November 3  Did  discuss with the patient the need for improved lifestyle including regular exercise, such as walking daily, and a heart healthy diet.  He is encouraged to establish with a primary care provider.  Patient is self-employed and uninsured.  Have asked social work to consult in reguard to financial considerations     DVT prophylaxis: Lovenox (Lovenox/Heparin/SCD's/anticoagulated/None (if comfort care) Code Status: Full code (Full/Partial (specify details) Family Communication: Asked me not to disturb his wife at this early hour of the morning (Specify name, relationship. Do not write "discussed with patient". Specify tel # if discussed over the phone) Disposition Plan: Home in 24 to 36 hours (specify when and where you expect patient to be discharged) Consults called: None (with names) Admission status: Observation/telemetry (inpatient / obs / tele / medical floor / SDU)   Adella Hare MD [facility] Pager [phone]  If 7PM-7AM, please contact night-coverage [email or web address] Password [facility]  11/22/2018, 5:38 AM

## 2018-11-22 NOTE — Progress Notes (Signed)
Clarified with patient and educated patient that we need to order low bed for him per policy since he had syncopal episode. Patient stated that he did not fall, he was dizzy because he was not eating or drinking all day due to tooth pain. Stated he is doing fine, refusing at this time bed alarm and low bed. Will continue to monitor.

## 2018-11-22 NOTE — Progress Notes (Signed)
48 year old gentleman with with no known past medical history who was admitted early morning due to syncope and pain in the left jaw (please review H&P for details).  Patient seen and examined.  He states that he feels much better.  No chest pain or shortness of breath or any left jaw pain. On exam, he is obese, alert and oriented, comfortable Lungs clear to auscultation.  No crackles or rhonchi. Abdomen soft and nontender. Neuro exam unremarkable.  Patient was started on Augmentin for possible dental infection but no abscess.  We will continue that.  EKG was found to have T wave inversion in the lateral leads.  No history of prior cardiac disease or heart attacks.  Troponin slightly elevated at 59 and flat.  Will check 2 more sets of troponins.  We will also check transthoracic echo to rule out wall motion abnormality.  Patient's hemoglobin A1c is also 6.4 making him prediabetic.  We will start him on SSI for that.  Lipid profile within normal limits.  I extensively counseled him regarding weight loss and he verbalized understanding.

## 2018-11-22 NOTE — ED Notes (Signed)
Provided updates

## 2018-11-22 NOTE — Progress Notes (Signed)
CSW acknowledge consult for financial assistance which they are not open weekend.  TOC team will follow up on Monday.

## 2018-11-23 ENCOUNTER — Inpatient Hospital Stay (HOSPITAL_COMMUNITY): Payer: Self-pay

## 2018-11-23 DIAGNOSIS — R7303 Prediabetes: Secondary | ICD-10-CM | POA: Diagnosis present

## 2018-11-23 DIAGNOSIS — R55 Syncope and collapse: Secondary | ICD-10-CM

## 2018-11-23 DIAGNOSIS — I10 Essential (primary) hypertension: Secondary | ICD-10-CM | POA: Diagnosis present

## 2018-11-23 LAB — GLUCOSE, CAPILLARY
Glucose-Capillary: 90 mg/dL (ref 70–99)
Glucose-Capillary: 93 mg/dL (ref 70–99)

## 2018-11-23 LAB — CBC WITH DIFFERENTIAL/PLATELET
Abs Immature Granulocytes: 0.05 10*3/uL (ref 0.00–0.07)
Basophils Absolute: 0.1 10*3/uL (ref 0.0–0.1)
Basophils Relative: 0 %
Eosinophils Absolute: 0.4 10*3/uL (ref 0.0–0.5)
Eosinophils Relative: 4 %
HCT: 30 % — ABNORMAL LOW (ref 39.0–52.0)
Hemoglobin: 8.9 g/dL — ABNORMAL LOW (ref 13.0–17.0)
Immature Granulocytes: 0 %
Lymphocytes Relative: 19 %
Lymphs Abs: 2.1 10*3/uL (ref 0.7–4.0)
MCH: 22.4 pg — ABNORMAL LOW (ref 26.0–34.0)
MCHC: 29.7 g/dL — ABNORMAL LOW (ref 30.0–36.0)
MCV: 75.4 fL — ABNORMAL LOW (ref 80.0–100.0)
Monocytes Absolute: 0.9 10*3/uL (ref 0.1–1.0)
Monocytes Relative: 8 %
Neutro Abs: 7.6 10*3/uL (ref 1.7–7.7)
Neutrophils Relative %: 69 %
Platelets: 260 10*3/uL (ref 150–400)
RBC: 3.98 MIL/uL — ABNORMAL LOW (ref 4.22–5.81)
RDW: 18.7 % — ABNORMAL HIGH (ref 11.5–15.5)
WBC: 11.2 10*3/uL — ABNORMAL HIGH (ref 4.0–10.5)
nRBC: 0 % (ref 0.0–0.2)

## 2018-11-23 LAB — BASIC METABOLIC PANEL
Anion gap: 9 (ref 5–15)
BUN: 18 mg/dL (ref 6–20)
CO2: 24 mmol/L (ref 22–32)
Calcium: 8.2 mg/dL — ABNORMAL LOW (ref 8.9–10.3)
Chloride: 105 mmol/L (ref 98–111)
Creatinine, Ser: 1.06 mg/dL (ref 0.61–1.24)
GFR calc Af Amer: >60 mL/min (ref >60)
GFR calc non Af Amer: >60 mL/min (ref >60)
Glucose, Bld: 104 mg/dL — ABNORMAL HIGH (ref 70–99)
Potassium: 4.1 mmol/L (ref 3.5–5.1)
Sodium: 138 mmol/L (ref 135–145)

## 2018-11-23 LAB — CK TOTAL AND CKMB (NOT AT ARMC)
CK, MB: 1.3 ng/mL (ref 0.5–5.0)
Relative Index: INVALID (ref 0.0–2.5)
Total CK: 85 U/L (ref 49–397)

## 2018-11-23 MED ORDER — METFORMIN HCL 500 MG PO TABS: 500.0000 mg | ORAL_TABLET | Freq: Two times a day (BID) | ORAL | 0 refills | Status: DC

## 2018-11-23 MED ORDER — AMLODIPINE BESYLATE 5 MG PO TABS: 5.0000 mg | ORAL_TABLET | Freq: Every day | ORAL | 0 refills | Status: DC

## 2018-11-23 MED ORDER — ASPIRIN 81 MG PO TBEC: 81.0000 mg | DELAYED_RELEASE_TABLET | Freq: Every day | ORAL | 0 refills | Status: DC

## 2018-11-23 MED ORDER — AMOXICILLIN-POT CLAVULANATE 875-125 MG PO TABS: 1.0000 | ORAL_TABLET | Freq: Two times a day (BID) | ORAL | 0 refills | Status: DC

## 2018-11-23 NOTE — Discharge Summary (Signed)
Physician Discharge Summary  Carl Barron HER:[id number] DOB: [birth year] DOA: 11/21/2018  PCP: Patient, No Pcp Per  Admit date: 11/21/2018 Discharge date: 11/23/2018  Admitted From: Home Disposition: Home  Recommendations for Outpatient Follow-up:  1. Follow up with PCP in 1-2 weeks 2. Follow-up with your dentist tomorrow 3. Please obtain BMP/CBC in one week 4. Please follow up on the following pending results:  Home Health: None Equipment/Devices: None  Discharge Condition: Stable CODE STATUS: Stable Diet recommendation: Cardiac  Subjective: Seen and examined.  Feels better except some left jaw pain.  No other complaint.  Brief/Interim Summary: This is a pleasant 48 year old gentleman with no known past medical history who was admitted due to syncope and pain in the left jaw.  CT head was negative.  D-dimer was elevated so CT angiogram was done and he was ruled out of PE.  CT maxillofacial showed left-sided pre maxillary soft tissue swelling without evidence for an associated abscess.  He was started on Augmentin and admitted under hospitalist service for further work-up.  He was also found to have T wave inversion on the EKG.  There were no prior EKGs to compare with.  Cardiac enzymes were tested which were slightly elevated but flat.  Patient did not have any chest pain or shortness of breath.  He was monitored on telemetry which did not show any arrhythmia.  Transthoracic echo was done which did not show any wall motion abnormality.  Patient was slightly hypertensive and was also diagnosed with prediabetes with hemoglobin A1c of 6.4.  Interestingly, his lipid profile was within normal limits.  He was counseled regarding weight loss and exercise to better control his diabetes and hypertension.  He verbalized understanding. He is being discharged in stable condition.  I am discharging him on amlodipine 5 mg p.o. daily, Metformin 500 mg p.o. twice daily, aspirin 81 mg p.o. daily and I also  advised him to establish relationship with the PCP and possibly arrange outpatient stress test.  Discharge Diagnoses:  Active Problems:   Abnormal EKG   Syncope and collapse   Elevated troponin   Dental infection   Pre-diabetes   Essential hypertension    Discharge Instructions  Discharge Instructions    Discharge patient   Complete by: As directed    Discharge disposition: 01-Home or Self Care   Discharge patient date: 11/23/2018     Allergies as of 11/23/2018   No Known Allergies     Medication List    TAKE these medications   amLODipine 5 MG tablet Commonly known as: NORVASC Take 1 tablet (5 mg total) by mouth daily.   amoxicillin-clavulanate 875-125 MG tablet Commonly known as: Augmentin Take 1 tablet by mouth 2 (two) times daily for 10 days.   aspirin 81 MG EC tablet Take 1 tablet (81 mg total) by mouth daily.   bacitracin ointment Apply 1 application topically 2 (two) times daily.   esomeprazole 20 MG capsule Commonly known as: NEXIUM Take 20 mg by mouth as needed (heartburn).   ibuprofen 200 MG tablet Commonly known as: ADVIL Take 400 mg by mouth every 6 (six) hours as needed for moderate pain.   metFORMIN 500 MG tablet Commonly known as: Glucophage Take 1 tablet (500 mg total) by mouth 2 (two) times daily with a meal.   PROBIOTIC-10 PO Take 1 tablet by mouth daily.      Follow-up Information    [facility] Go on 12/03/2018.   Why: @ 8:50AM please arrive  15-20 miniutes early Contact information: [address] [place] [address] [phone]         No Known Allergies  Consultations: None   Procedures/Studies: Ct Head Wo Contrast  Result Date: 11/21/2018 CLINICAL DATA:  Facial swelling. EXAM: CT HEAD WITHOUT CONTRAST CT MAXILLOFACIAL WITHOUT CONTRAST TECHNIQUE: Multidetector CT imaging of the head and maxillofacial structures were performed using the standard protocol without  intravenous contrast. Multiplanar CT image reconstructions of the maxillofacial structures were also generated. COMPARISON:  None. FINDINGS: CT HEAD FINDINGS Brain: No evidence of acute infarction, hemorrhage, hydrocephalus, extra-axial collection or mass lesion/mass effect. Vascular: No hyperdense vessel or unexpected calcification. Skull: Normal. Negative for fracture or focal lesion. Other: None. CT MAXILLOFACIAL FINDINGS Osseous: No fracture or mandibular dislocation. No destructive process. Orbits: Negative. No traumatic or inflammatory finding. Sinuses: There is mild bilateral maxillary mucosal thickening. The rest of the paranasal sinuses and mastoid air cells are essentially clear. Soft tissues: There is left pre maxillary soft tissue swelling. There is no abscess. Multiple dental caries are noted. IMPRESSION: 1. No acute intracranial abnormality. 2. There is left-sided pre maxillary soft tissue swelling without evidence for an associated abscess. 3. Multiple dental caries are noted. Electronically Signed   By: Katherine Mantle M.D.   On: 11/21/2018 22:19   Ct Angio Chest Pe W/cm &/or Wo Cm  Result Date: 11/21/2018 CLINICAL DATA:  Suspected PE. Positive D-dimer. Syncope. Tachycardia. EXAM: CT ANGIOGRAPHY CHEST WITH CONTRAST TECHNIQUE: Multidetector CT imaging of the chest was performed using the standard protocol during bolus administration of intravenous contrast. Multiplanar CT image reconstructions and MIPs were obtained to evaluate the vascular anatomy. CONTRAST:  75 mL of Omnipaque 350 COMPARISON:  None. FINDINGS: Cardiovascular: The thoracic aorta is normal without aneurysm or dissection. No atherosclerotic change identified. The heart size is unremarkable. No obvious coronary artery calcifications are identified. Evaluation of pulmonary arteries is somewhat limited due to respiratory motion. Within these limitations, no pulmonary emboli identified. Mediastinum/Nodes: No enlarged mediastinal,  hilar, or axillary lymph nodes. Thyroid gland, trachea, and esophagus demonstrate no significant findings. Lungs/Pleura: Central airways are normal. No pneumothorax. No nodules, masses, or focal infiltrates. Upper Abdomen: No acute abnormality. Musculoskeletal: No chest wall abnormality. No acute or significant osseous findings. Review of the MIP images confirms the above findings. IMPRESSION: 1. Evaluation for pulmonary emboli is somewhat limited due to respiratory motion. Within these limitations, no central emboli identified. 2. No other acute abnormalities are noted. Electronically Signed   By: Gerome Sam III M.D   On: 11/21/2018 20:56   Ct Maxillofacial Wo Cm  Result Date: 11/21/2018 CLINICAL DATA:  Facial swelling. EXAM: CT HEAD WITHOUT CONTRAST CT MAXILLOFACIAL WITHOUT CONTRAST TECHNIQUE: Multidetector CT imaging of the head and maxillofacial structures were performed using the standard protocol without intravenous contrast. Multiplanar CT image reconstructions of the maxillofacial structures were also generated. COMPARISON:  None. FINDINGS: CT HEAD FINDINGS Brain: No evidence of acute infarction, hemorrhage, hydrocephalus, extra-axial collection or mass lesion/mass effect. Vascular: No hyperdense vessel or unexpected calcification. Skull: Normal. Negative for fracture or focal lesion. Other: None. CT MAXILLOFACIAL FINDINGS Osseous: No fracture or mandibular dislocation. No destructive process. Orbits: Negative. No traumatic or inflammatory finding. Sinuses: There is mild bilateral maxillary mucosal thickening. The rest of the paranasal sinuses and mastoid air cells are essentially clear. Soft tissues: There is left pre maxillary soft tissue swelling. There is no abscess. Multiple dental caries are noted. IMPRESSION: 1. No acute intracranial abnormality. 2. There is left-sided pre maxillary  soft tissue swelling without evidence for an associated abscess. 3. Multiple dental caries are noted.  Electronically Signed   By: Katherine Mantle M.D.   On: 11/21/2018 22:19     Discharge Exam: Vitals:   11/23/18 0117 11/23/18 0439  BP: 121/77 (!) 141/100  Pulse: 75 87  Resp: 20 16  Temp: 98.8 F (37.1 C) 98.4 F (36.9 C)  SpO2: 100% 99%   Vitals:   11/22/18 0839 11/22/18 1158 11/23/18 0117 11/23/18 0439  BP: (!) 159/104 (!) 158/103 121/77 (!) 141/100  Pulse:  86 75 87  Resp:  20 20 16   Temp:  98.6 F (37 C) 98.8 F (37.1 C) 98.4 F (36.9 C)  TempSrc:  Oral Oral Oral  SpO2: 98% 100% 100% 99%  Weight:    101.9 kg  Height:        General: Pt is alert, awake, not in acute distress Cardiovascular: RRR, S1/S2 +, no rubs, no gallops Respiratory: CTA bilaterally, no wheezing, no rhonchi Abdominal: Soft, NT, ND, bowel sounds + Extremities: no edema, no cyanosis    The results of significant diagnostics from this hospitalization (including imaging, microbiology, ancillary and laboratory) are listed below for reference.     Microbiology: Recent Results (from the past 240 hour(s))  Culture, blood (routine x 2)     Status: None (Preliminary result)   Collection Time: 11/21/18  7:39 PM   Specimen: BLOOD  Result Value Ref Range Status   Specimen Description   Final    BLOOD LEFT ANTECUBITAL Performed at [facility], [address], [place], [place]    Special Requests   Final    BOTTLES DRAWN AEROBIC AND ANAEROBIC Blood Culture adequate volume Performed at [facility], [address] [place], [place]    Culture   Final    NO GROWTH 2 DAYS Performed at [facility], [address] [place], [place]    Report Status PENDING  Incomplete  Culture, blood (routine x 2)     Status: None (Preliminary result)   Collection Time: 11/21/18  7:44 PM   Specimen: BLOOD  Result Value Ref Range Status   Specimen Description   Final    BLOOD RIGHT ANTECUBITAL Performed at [facility], [address], [place],  [place]    Special Requests   Final    BOTTLES DRAWN AEROBIC AND ANAEROBIC Blood Culture adequate volume Performed at [facility], [address] [place], [place]    Culture   Final    NO GROWTH 2 DAYS Performed at [facility], [address], [place], [place]    Report Status PENDING  Incomplete  SARS CORONAVIRUS 2 (TAT 6-24 HRS) Nasopharyngeal Nasopharyngeal Swab     Status: None   Collection Time: 11/21/18  7:58 PM   Specimen: Nasopharyngeal Swab  Result Value Ref Range Status   SARS Coronavirus 2 NEGATIVE NEGATIVE Final    Comment: (NOTE) SARS-CoV-2 target nucleic acids are NOT DETECTED. The SARS-CoV-2 RNA is generally detectable in upper and lower respiratory specimens during the acute phase of infection. Negative results do not preclude SARS-CoV-2 infection, do not rule out co-infections with other pathogens, and should not be used as the sole basis for treatment or other patient management decisions. Negative results must be combined with clinical observations, patient history, and epidemiological information. The expected result is Negative. Fact Sheet for Patients: 11/23/18 Fact  Sheet for Healthcare Providers: quierodirigir.comhttps://www.fda.gov/media/138095/download This test is not yet approved or cleared by the [place] FDA and  has been authorized for detection and/or diagnosis of SARS-CoV-2 by FDA under an Emergency Use Authorization (EUA). This EUA will remain  in effect (meaning this test can be used) for the duration of the COVID-19 declaration under Section 56 4(b)(1) of the Act, 21 U.S.C. section 360bbb-3(b)(1), unless the authorization is terminated or revoked sooner. Performed at [facility], [address] [place], [place] [address]      Labs: BNP (last 3 results) No results for input(s): BNP in the last 8760 hours. Basic Metabolic Panel: Recent Labs  Lab 11/21/18 1821  11/23/18 0539  NA 138 138  K 4.5 4.1  CL 107 105  CO2 23 24  GLUCOSE 186* 104*  BUN 40* 18  CREATININE 0.93 1.06  CALCIUM 9.1 8.2*   Liver Function Tests: Recent Labs  Lab 11/21/18 1821  AST 18  ALT 20  ALKPHOS 37*  BILITOT 0.5  PROT 6.5  ALBUMIN 3.5   No results for input(s): LIPASE, AMYLASE in the last 168 hours. No results for input(s): AMMONIA in the last 168 hours. CBC: Recent Labs  Lab 11/21/18 1821 11/22/18 1151 11/23/18 0539  WBC 15.8* 15.5* 11.2*  NEUTROABS 14.4* 10.9* 7.6  HGB 11.1* 10.2* 8.9*  HCT 38.6* 34.0* 30.0*  MCV 77.4* 75.7* 75.4*  PLT 276 262 260   Cardiac Enzymes: Recent Labs  Lab 11/22/18 0452 11/22/18 1635 11/23/18 0539  CKTOTAL 117 111 85  CKMB 2.3 1.8 1.3   BNP: Invalid input(s): POCBNP CBG: Recent Labs  Lab 11/22/18 1154 11/22/18 1638 11/22/18 2108 11/23/18 0559 11/23/18 1212  GLUCAP 98 109* 109* 90 93   D-Dimer Recent Labs    11/21/18 1821  DDIMER 0.79*   Hgb A1c Recent Labs    11/22/18 0452  HGBA1C 6.4*   Lipid Profile Recent Labs    11/22/18 0452  CHOL 140  HDL 44  LDLCALC 76  TRIG 99  CHOLHDL 3.2   Thyroid function studies No results for input(s): TSH, T4TOTAL, T3FREE, THYROIDAB in the last 72 hours.  Invalid input(s): FREET3 Anemia work up No results for input(s): VITAMINB12, FOLATE, FERRITIN, TIBC, IRON, RETICCTPCT in the last 72 hours. Urinalysis No results found for: COLORURINE, APPEARANCEUR, LABSPEC, PHURINE, GLUCOSEU, HGBUR, BILIRUBINUR, KETONESUR, PROTEINUR, UROBILINOGEN, NITRITE, LEUKOCYTESUR Sepsis Labs Invalid input(s): PROCALCITONIN,  WBC,  LACTICIDVEN Microbiology Recent Results (from the past 240 hour(s))  Culture, blood (routine x 2)     Status: None (Preliminary result)   Collection Time: 11/21/18  7:39 PM   Specimen: BLOOD  Result Value Ref Range Status   Specimen Description   Final    BLOOD LEFT ANTECUBITAL Performed at [facility], [address] [place],  [place] [address]    Special Requests   Final    BOTTLES DRAWN AEROBIC AND ANAEROBIC Blood Culture adequate volume Performed at [facility] [place], [address] [place], [place] [address]    Culture   Final    NO GROWTH 2 DAYS Performed at [facility], [address] [place], [place] [address]    Report Status PENDING  Incomplete  Culture, blood (routine x 2)     Status: None (Preliminary result)   Collection Time: 11/21/18  7:44 PM   Specimen: BLOOD  Result Value Ref Range Status   Specimen Description   Final    BLOOD RIGHT ANTECUBITAL Performed at  [facility], [address], [place], [place] [address]    Special Requests   Final    BOTTLES DRAWN AEROBIC AND ANAEROBIC Blood Culture adequate volume Performed at [facility], [address] [place], [place] [address]    Culture   Final    NO GROWTH 2 DAYS Performed at [facility], [address] [place], [place] [address]    Report Status PENDING  Incomplete  SARS CORONAVIRUS 2 (TAT 6-24 HRS) Nasopharyngeal Nasopharyngeal Swab     Status: None   Collection Time: 11/21/18  7:58 PM   Specimen: Nasopharyngeal Swab  Result Value Ref Range Status   SARS Coronavirus 2 NEGATIVE NEGATIVE Final    Comment: (NOTE) SARS-CoV-2 target nucleic acids are NOT DETECTED. The SARS-CoV-2 RNA is generally detectable in upper and lower respiratory specimens during the acute phase of infection. Negative results do not preclude SARS-CoV-2 infection, do not rule out co-infections with other pathogens, and should not be used as the sole basis for treatment or other patient management decisions. Negative results must be combined with clinical observations, patient history, and epidemiological information. The expected result is Negative. Fact Sheet for Patients: HairSlick.no Fact Sheet for Healthcare Providers: quierodirigir.com This test is not yet approved  or cleared by the [place] FDA and  has been authorized for detection and/or diagnosis of SARS-CoV-2 by FDA under an Emergency Use Authorization (EUA). This EUA will remain  in effect (meaning this test can be used) for the duration of the COVID-19 declaration under Section 56 4(b)(1) of the Act, 21 U.S.C. section 360bbb-3(b)(1), unless the authorization is terminated or revoked sooner. Performed at [facility], [address] [place], [place] [address]      Time coordinating discharge: Over 30 minutes  SIGNED:   Hughie Closs, MD  [facility] 11/23/2018, 3:28 PM  If 7PM-7AM, please contact night-coverage [email or web address] Password [email or web address]

## 2018-11-23 NOTE — Progress Notes (Signed)
Pt has discharge orders. However, MD does not want patient discharged until 2D echocardigram is interpreted. Will continue to monitor.

## 2018-11-23 NOTE — Discharge Instructions (Signed)

## 2018-11-23 NOTE — Progress Notes (Signed)
Patient discharged: Home with family  Via: Wheelchair   Discharge paperwork given: to patient and family  Reviewed with teach back  IV and telemetry disconnected  Belongings given to patient    

## 2018-11-23 NOTE — TOC Transition Note (Signed)
Transition of Care [facility]) - CM/SW Discharge Note   Patient Details  Name: Carl Barron MRN: [id number] Date of Birth: Oct 31, 1970  [facility] CM/SW Contact:  Zenon Mayo, RN Phone Number: 11/23/2018, 9:47 AM   Clinical Narrative:    From home with wife. Patient has no insurance and no PCP, he is scheduled to go to [place] [facility] and NCM assisted him with meds with [employer].  NCM also asked financial counselor to speak with patient before he discharges today.    Final next level of care: Home/Self Care Barriers to Discharge: No Barriers Identified   Patient Goals and CMS Choice Patient states their goals for this hospitalization and ongoing recovery are:: loose weight  and exercise more      Discharge Placement                       Discharge Plan and Services                DME Arranged: (NA)         HH Arranged: NA          Social Determinants of Health (SDOH) Interventions     Readmission Risk Interventions No flowsheet data found.

## 2018-11-23 NOTE — Progress Notes (Signed)
ECHO done and interpreted. Per MD d/c the patient.

## 2018-11-23 NOTE — Progress Notes (Signed)
  Echocardiogram 2D Echocardiogram has been performed.  Carl Barron 11/23/2018, 11:55 AM

## 2018-11-24 LAB — GLUCOSE, CAPILLARY: Glucose-Capillary: 103 mg/dL — ABNORMAL HIGH (ref 70–99)

## 2018-11-26 LAB — CULTURE, BLOOD (ROUTINE X 2)
Culture: NO GROWTH
Culture: NO GROWTH
Special Requests: ADEQUATE
Special Requests: ADEQUATE

## 2018-12-02 NOTE — Progress Notes (Signed)
Patient ID: Carl Barron, male   DOB: 06/11/1970, 48 y.o.   MRN: [id number]   Carl Barron, is a 48 y.o. male  [id number]  [id number]  DOB - 15-Oct-1970  Subjective:  Chief Complaint and HPI: Carl Barron is a 48 y.o. male here today to establish care and for a follow up visit After hospitalization from 10/31-11/02/2018 for syncope.  No further episodes.  No CP/SOB/dizziness.  Also found to be diabetic.  He has a glucometer and home BP cuff.    From discharge summary: Brief/Interim Summary: This is a pleasant 48 year old gentleman with no known past medical history who was admitted due to syncope and pain in the left jaw.  CT head was negative.  D-dimer was elevated so CT angiogram was done and he was ruled out of PE.  CT maxillofacial showed left-sided pre maxillary soft tissue swelling without evidence for an associated abscess.  He was started on Augmentin and admitted under hospitalist service for further work-up.  He was also found to have T wave inversion on the EKG.  There were no prior EKGs to compare with.  Cardiac enzymes were tested which were slightly elevated but flat.  Patient did not have any chest pain or shortness of breath.  He was monitored on telemetry which did not show any arrhythmia.  Transthoracic echo was done which did not show any wall motion abnormality.  Patient was slightly hypertensive and was also diagnosed with prediabetes with hemoglobin A1c of 6.4.  Interestingly, his lipid profile was within normal limits.  He was counseled regarding weight loss and exercise to better control his diabetes and hypertension.  He verbalized understanding. He is being discharged in stable condition.  I am discharging him on amlodipine 5 mg p.o. daily, Metformin 500 mg p.o. twice daily, aspirin 81 mg p.o. daily and I also advised him to establish relationship with the PCP and possibly arrange outpatient stress test.  Discharge Diagnoses:  Active Problems:   Abnormal EKG  Syncope and collapse   Elevated troponin   Dental infection   Pre-diabetes   Essential hypertension   ED/Hospital notes reviewed.     ROS:   Constitutional:  No f/c, No night sweats, No unexplained weight loss. EENT:  No vision changes, No blurry vision, No hearing changes. No mouth, throat, or ear problems.  Respiratory: No cough, No SOB Cardiac: No CP, no palpitations GI:  No abd pain, No N/V/D. GU: No Urinary s/sx Musculoskeletal: No joint pain Neuro: No headache, no dizziness, no motor weakness.  Skin: No rash Endocrine:  No polydipsia. No polyuria.  Psych: Denies SI/HI  No problems updated.  ALLERGIES: No Known Allergies  PAST MEDICAL HISTORY: No past medical history on file.  MEDICATIONS AT HOME: Prior to Admission medications   Medication Sig Start Date End Date Taking? Authorizing Provider  aspirin 81 MG EC tablet Take 1 tablet (81 mg total) by mouth daily. 12/03/18 01/02/19 Yes Sopheap Basic, Dionne Bucy, PA-C  esomeprazole (NEXIUM) 20 MG capsule Take 20 mg by mouth as needed (heartburn).   Yes [provider]  ibuprofen (ADVIL) 200 MG tablet Take 400 mg by mouth every 6 (six) hours as needed for moderate pain.   Yes [provider]  metFORMIN (GLUCOPHAGE) 500 MG tablet Take 1 tablet (500 mg total) by mouth 2 (two) times daily with a meal. 12/03/18 01/02/19 Yes Martiza Speth M, PA-C  Probiotic Product (PROBIOTIC-10 PO) Take 1 tablet by mouth daily.   Yes [provider]  amLODipine (NORVASC)  10 MG tablet Take 1 tablet (10 mg total) by mouth daily. 12/03/18   Anders Simmonds, PA-C  bacitracin ointment Apply 1 application topically 2 (two) times daily. Patient not taking: Reported on 11/22/2018 02/19/16   Arby Barrette, MD     Objective:  EXAM:   Vitals:   12/03/18 0912  BP: (!) 159/95  Pulse: 96  Temp: 98.2 F (36.8 C)  TempSrc: Oral  SpO2: 99%  Weight: 227 lb 12.8 oz (103.3 kg)  Height: 5\' 6"  (1.676 m)    General appearance :  A&OX3. NAD. Non-toxic-appearing HEENT: Atraumatic and Normocephalic.  PERRLA. EOM intact.   Neck: supple, no JVD. No cervical lymphadenopathy. No thyromegaly Chest/Lungs:  Breathing-non-labored, Good air entry bilaterally, breath sounds normal without rales, rhonchi, or wheezing  CVS: S1 S2 regular, no murmurs, gallops, rubs  Extremities: Bilateral Lower Ext shows no edema, both legs are warm to touch with = pulse throughout Neurology:  CN II-XII grossly intact, Non focal.   Psych:  TP linear. J/I WNL. Normal speech. Appropriate eye contact and affect.  Skin:  No Rash  Data Review Lab Results  Component Value Date   HGBA1C 6.4 (H) 11/22/2018     Assessment & Plan   1. Type 2 diabetes mellitus with hyperglycemia, without long-term current use of insulin (HCC) Continue to work on diet/exercise/weight loss.  Spent 30 mins face to face counseling on DM and htn - Glucose (CBG) - Ambulatory referral to Cardiology - metFORMIN (GLUCOPHAGE) 500 MG tablet; Take 1 tablet (500 mg total) by mouth 2 (two) times daily with a meal.  Dispense: 60 tablet; Refill: 3  2. Essential hypertension Not controlled.  Increase dose amlodipine and check BP daily OOO and bring to next visit - Comprehensive metabolic panel - Ambulatory referral to Cardiology - amLODipine (NORVASC) 10 MG tablet; Take 1 tablet (10 mg total) by mouth daily.  Dispense: 90 tablet; Refill: 1 - aspirin 81 MG EC tablet; Take 1 tablet (81 mg total) by mouth daily.  Dispense: 30 tablet; Refill: 0  3. Abnormal EKG - Ambulatory referral to Cardiology - aspirin 81 MG EC tablet; Take 1 tablet (81 mg total) by mouth daily.  Dispense: 30 tablet; Refill: 0  4. Syncope and collapse No further episodes. Call 911 if recurs - Ambulatory referral to Cardiology  5. Hospital discharge follow-up   6. Anemia, unspecified type - CBC with Differential  Patient have been counseled extensively about nutrition and exercise  Return in about 3  months (around 03/05/2019), or if symptoms worsen or fail to improve, for assign PCP; chronic conditions.  The patient was given clear instructions to go to ER or return to medical center if symptoms don't improve, worsen or new problems develop. The patient verbalized understanding. The patient was told to call to get lab results if they haven't heard anything in the next week.     05/03/2019, PA-C [facility] [place], [place]   12/03/2018, 10:49 AM

## 2018-12-03 ENCOUNTER — Ambulatory Visit: Payer: Self-pay | Attending: Family Medicine | Admitting: Physician Assistant

## 2018-12-03 ENCOUNTER — Other Ambulatory Visit: Payer: Self-pay

## 2018-12-03 VITALS — BP 159/95 | HR 96 | Temp 98.2°F | Ht 66.0 in | Wt 227.8 lb

## 2018-12-03 DIAGNOSIS — R9431 Abnormal electrocardiogram [ECG] [EKG]: Secondary | ICD-10-CM

## 2018-12-03 DIAGNOSIS — D649 Anemia, unspecified: Secondary | ICD-10-CM

## 2018-12-03 DIAGNOSIS — I10 Essential (primary) hypertension: Secondary | ICD-10-CM

## 2018-12-03 DIAGNOSIS — E1165 Type 2 diabetes mellitus with hyperglycemia: Secondary | ICD-10-CM

## 2018-12-03 DIAGNOSIS — Z09 Encounter for follow-up examination after completed treatment for conditions other than malignant neoplasm: Secondary | ICD-10-CM

## 2018-12-03 DIAGNOSIS — R55 Syncope and collapse: Secondary | ICD-10-CM

## 2018-12-03 LAB — GLUCOSE, POCT (MANUAL RESULT ENTRY): POC Glucose: 104 mg/dl — AB (ref 70–99)

## 2018-12-03 MED ORDER — AMLODIPINE BESYLATE 10 MG PO TABS: 10.0000 mg | ORAL_TABLET | Freq: Every day | ORAL | 1 refills | Status: AC

## 2018-12-03 MED ORDER — ASPIRIN 81 MG PO TBEC: 81.0000 mg | DELAYED_RELEASE_TABLET | Freq: Every day | ORAL | 0 refills | Status: AC

## 2018-12-03 MED ORDER — METFORMIN HCL 500 MG PO TABS: 500.0000 mg | ORAL_TABLET | Freq: Two times a day (BID) | ORAL | 3 refills | Status: AC

## 2018-12-03 MED FILL — AMLODIPINE BESYLATE 10 MG T: 10 | 30 days supply | Qty: 30 | Fill #0

## 2018-12-03 MED FILL — metFORMIN HCL 500 MG TABS: 500 | 30 days supply | Qty: 60 | Fill #0

## 2018-12-03 NOTE — Patient Instructions (Addendum)
Goal blood pressure= under 130/85  Check BP daily and record and bring to next visit.  Check fasting blood sugars fasting 3-4 times weekly and record.

## 2018-12-04 LAB — COMPREHENSIVE METABOLIC PANEL
ALT: 28 IU/L (ref 0–44)
AST: 28 IU/L (ref 0–40)
Albumin/Globulin Ratio: 1.9 (ref 1.2–2.2)
Albumin: 4.5 g/dL (ref 4.0–5.0)
Alkaline Phosphatase: 64 IU/L (ref 39–117)
BUN/Creatinine Ratio: 15 (ref 9–20)
BUN: 18 mg/dL (ref 6–24)
Bilirubin Total: <0.2 mg/dL (ref 0.0–1.2)
CO2: 23 mmol/L (ref 20–29)
Calcium: 9.8 mg/dL (ref 8.7–10.2)
Chloride: 100 mmol/L (ref 96–106)
Creatinine, Ser: 1.17 mg/dL (ref 0.76–1.27)
GFR calc Af Amer: 86 mL/min/{1.73_m2} (ref >59)
GFR calc non Af Amer: 74 mL/min/{1.73_m2} (ref >59)
Globulin, Total: 2.4 g/dL (ref 1.5–4.5)
Glucose: 92 mg/dL (ref 65–99)
Potassium: 4.9 mmol/L (ref 3.5–5.2)
Sodium: 140 mmol/L (ref 134–144)
Total Protein: 6.9 g/dL (ref 6.0–8.5)

## 2018-12-04 LAB — CBC WITH DIFFERENTIAL/PLATELET
Basophils Absolute: 0.1 10*3/uL (ref 0.0–0.2)
Basos: 1 %
EOS (ABSOLUTE): 0.6 10*3/uL — ABNORMAL HIGH (ref 0.0–0.4)
Eos: 5 %
Hematocrit: 33.4 % — ABNORMAL LOW (ref 37.5–51.0)
Hemoglobin: 9.8 g/dL — ABNORMAL LOW (ref 13.0–17.7)
Immature Grans (Abs): 0.1 10*3/uL (ref 0.0–0.1)
Immature Granulocytes: 1 %
Lymphocytes Absolute: 2.2 10*3/uL (ref 0.7–3.1)
Lymphs: 18 %
MCH: 21.9 pg — ABNORMAL LOW (ref 26.6–33.0)
MCHC: 29.3 g/dL — ABNORMAL LOW (ref 31.5–35.7)
MCV: 75 fL — ABNORMAL LOW (ref 79–97)
Monocytes Absolute: 0.8 10*3/uL (ref 0.1–0.9)
Monocytes: 7 %
Neutrophils Absolute: 8.4 10*3/uL — ABNORMAL HIGH (ref 1.4–7.0)
Neutrophils: 68 %
Platelets: 563 10*3/uL — ABNORMAL HIGH (ref 150–450)
RBC: 4.47 x10E6/uL (ref 4.14–5.80)
RDW: 17.6 % — ABNORMAL HIGH (ref 11.6–15.4)
WBC: 12.2 10*3/uL — ABNORMAL HIGH (ref 3.4–10.8)

## 2018-12-09 ENCOUNTER — Encounter (INDEPENDENT_AMBULATORY_CARE_PROVIDER_SITE_OTHER): Payer: Self-pay | Admitting: *Deleted

## 2018-12-09 ENCOUNTER — Encounter: Payer: Self-pay | Admitting: *Deleted

## 2021-01-02 IMAGING — CT CT MAXILLOFACIAL W/O CM
3 series · 16 of 47 positions shown, 19 images · non-contrast
Comparison: None.

CLINICAL DATA: Facial swelling.

EXAM:
CT HEAD WITHOUT CONTRAST
CT MAXILLOFACIAL WITHOUT CONTRAST
TECHNIQUE: Multidetector CT imaging of the head and maxillofacial structures
were performed using the standard protocol without intravenous
contrast. Multiplanar CT image reconstructions of the maxillofacial
structures were also generated.

[Series 2: max soft · axial · 0.41mm/px · z∈[+695,+837]mm · 10 of 83 slices shown, 13 images]
[im 6/83  brain]
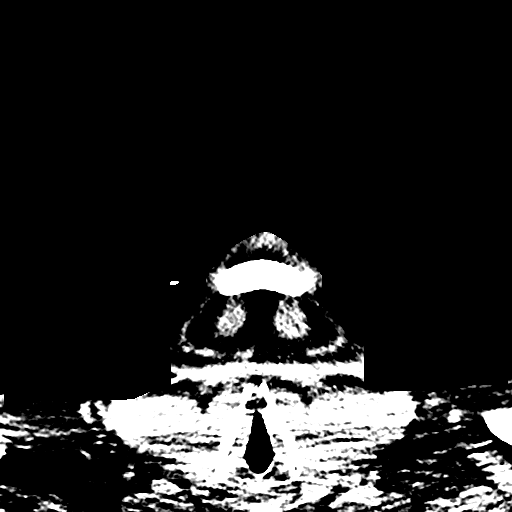
[im 6/83  bone]
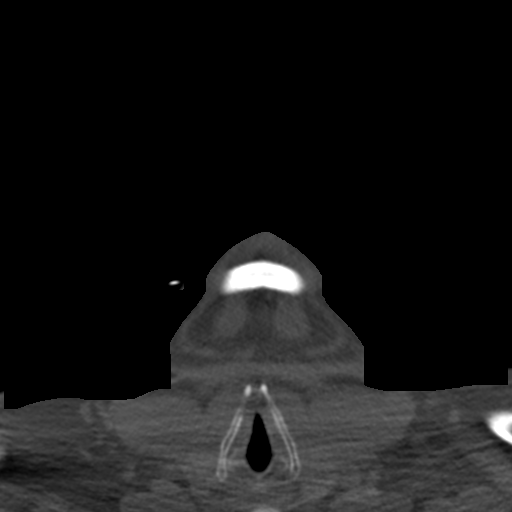
[im 15/83  bone]
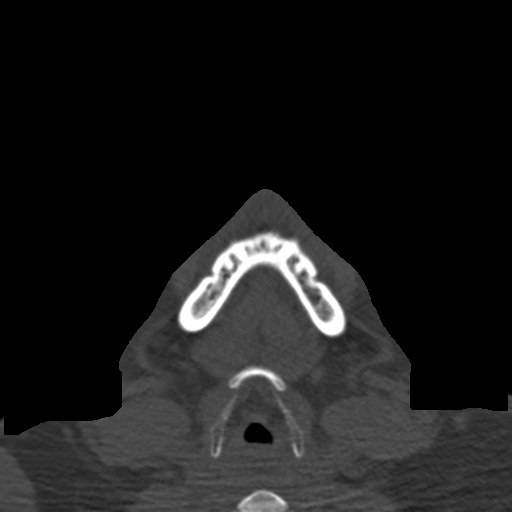
[im 23/83  bone]
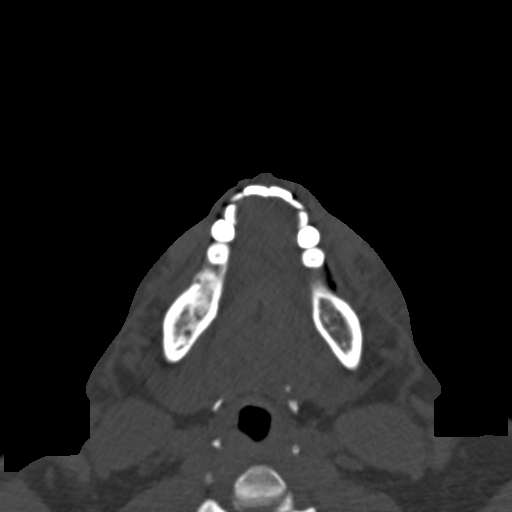
[im 29/83  bone]
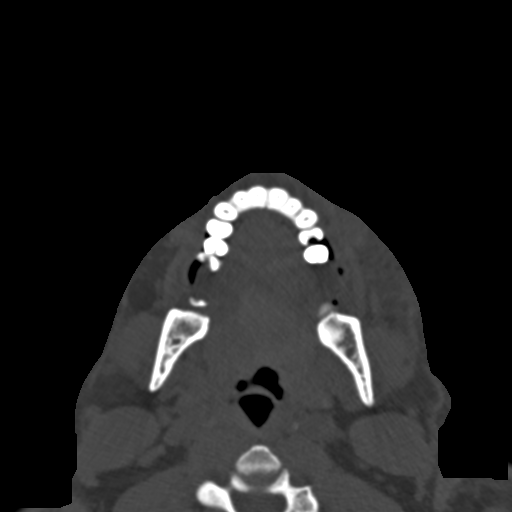
[im 37/83  brain]
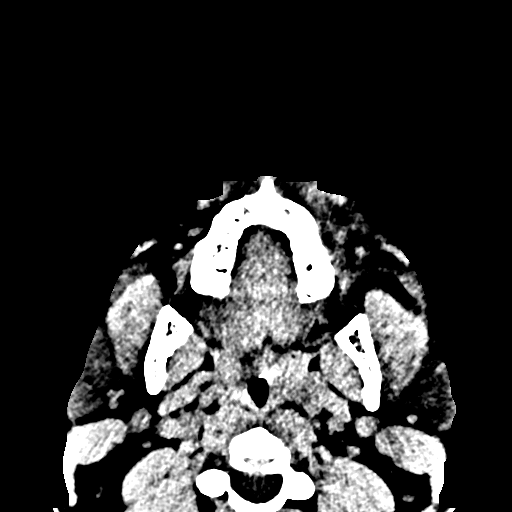
[im 37/83  bone]
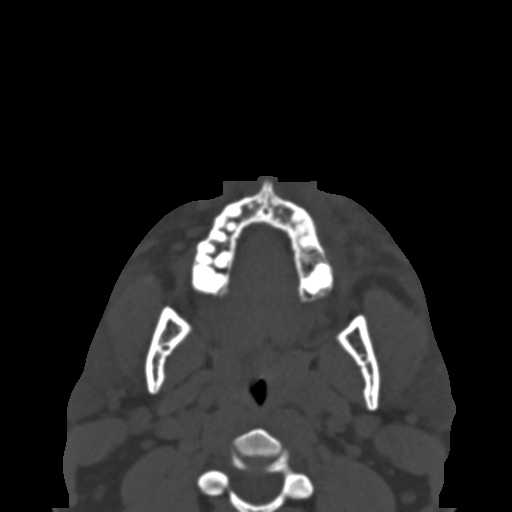
[im 46/83  bone]
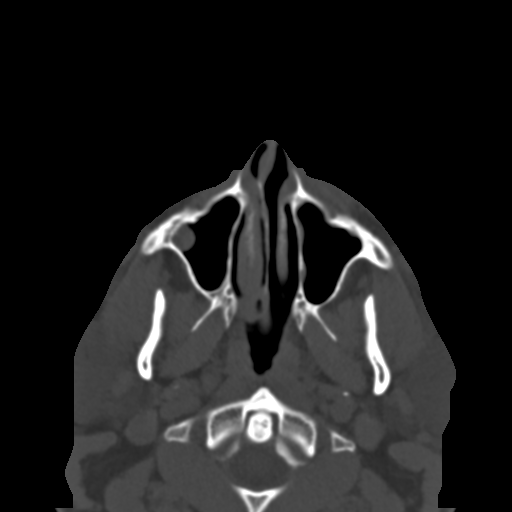
[im 54/83  bone]
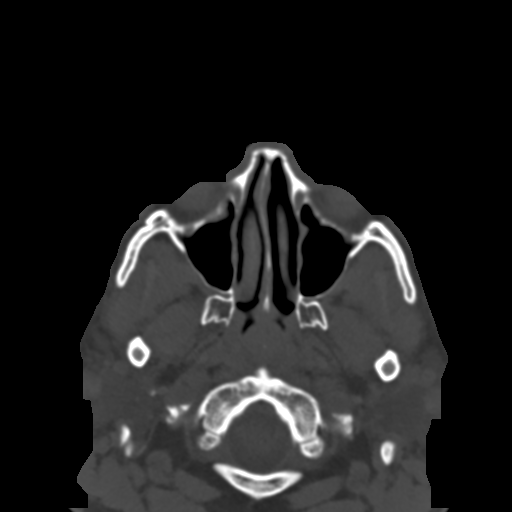
[im 63/83  bone]
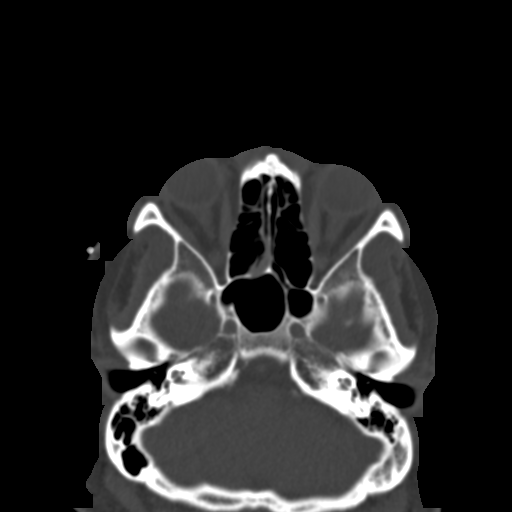
[im 68/83  brain]
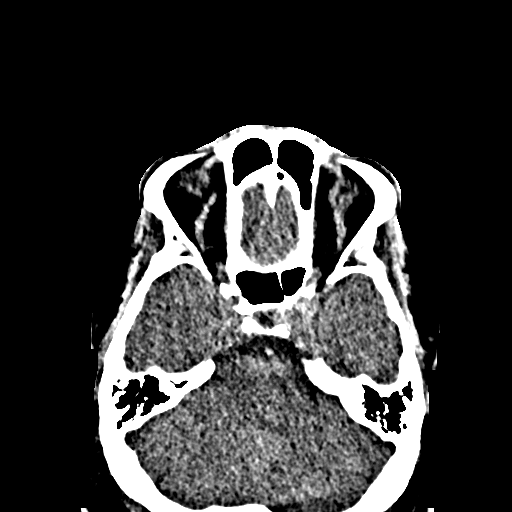
[im 68/83  bone]
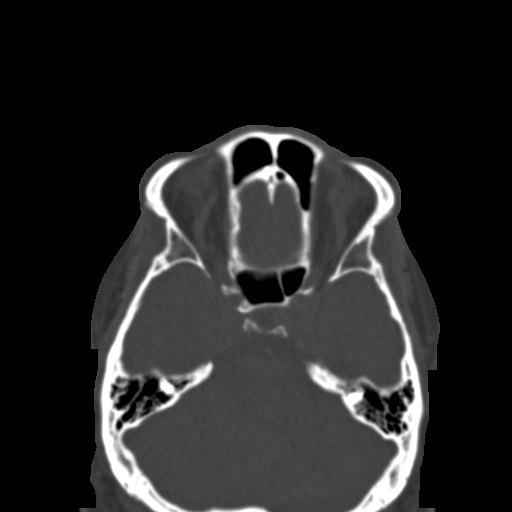
[im 77/83  bone]
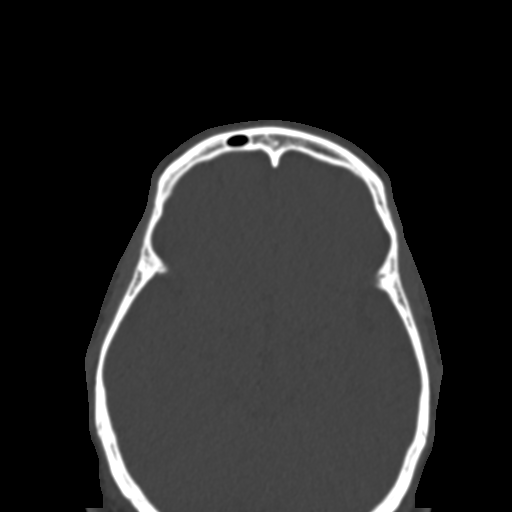

[Series 4: coronal soft · coronal · 0.38mm/px · 3 of 79 slices shown]
[im 27/79  bone]
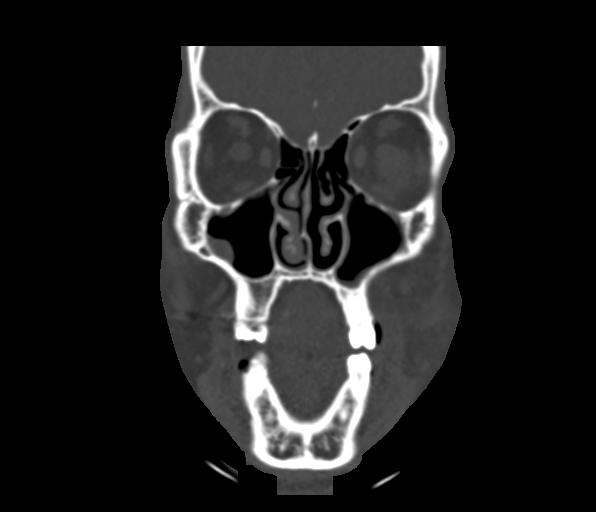
[im 35/79  bone]
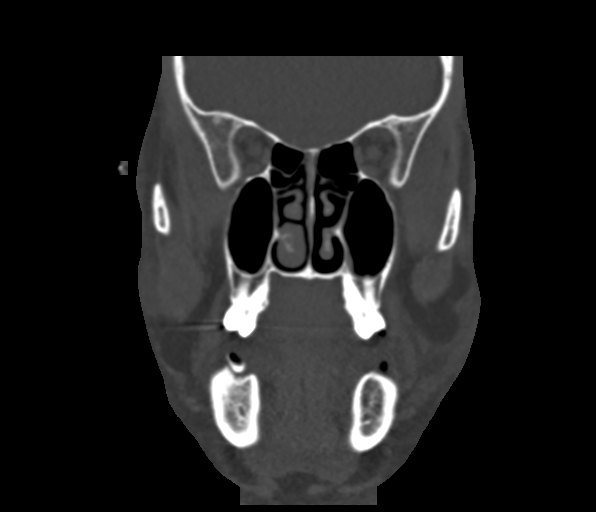
[im 44/79  bone]
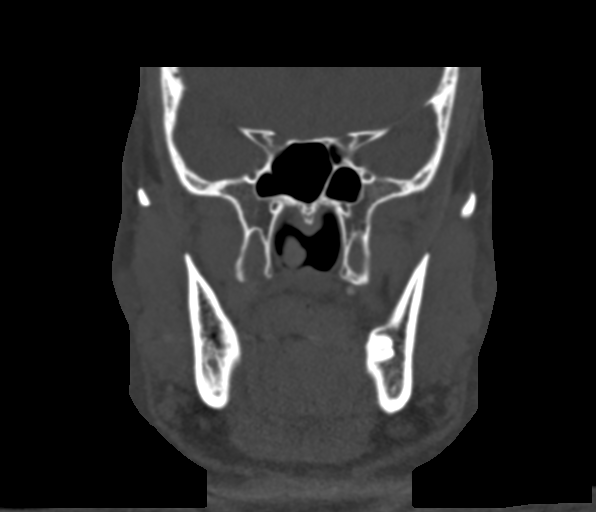

[Series 5: sagittal soft · sagittal · 0.34mm/px · 3 of 95 slices shown]
[im 32/95  bone]
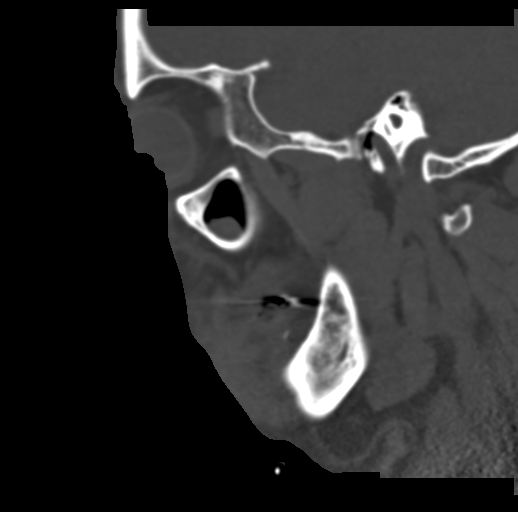
[im 48/95  bone]
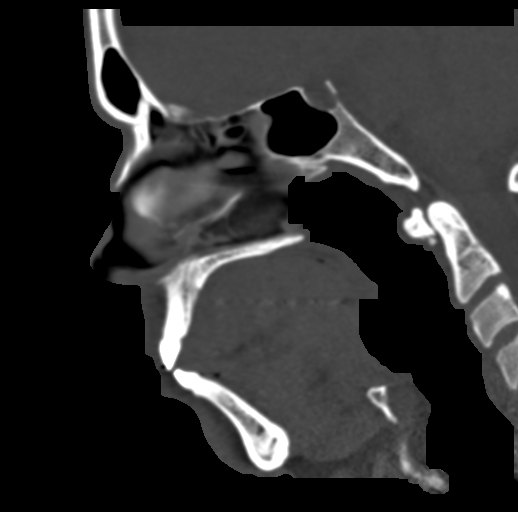
[im 63/95  bone]
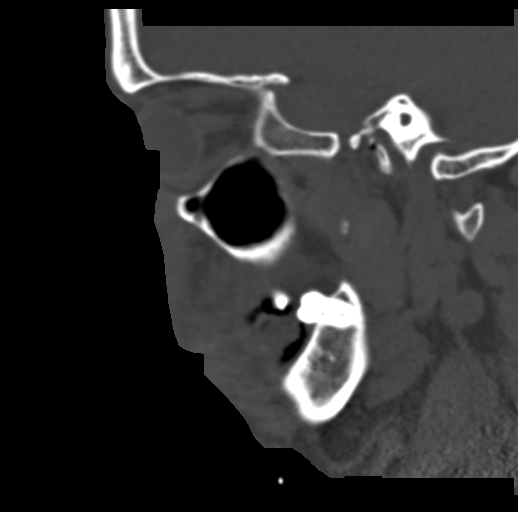

[16 of 47 positions shown; findings below may reference images not displayed]

FINDINGS: CT HEAD FINDINGS

Brain: No evidence of acute infarction, hemorrhage, hydrocephalus,
extra-axial collection or mass lesion/mass effect.

Vascular: No hyperdense vessel or unexpected calcification.

Skull: Normal. Negative for fracture or focal lesion.

Other: None.

CT MAXILLOFACIAL FINDINGS

Osseous: No fracture or mandibular dislocation. No destructive
process.

Orbits: Negative. No traumatic or inflammatory finding.

Sinuses: There is mild bilateral maxillary mucosal thickening. The
rest of the paranasal sinuses and mastoid air cells are essentially
clear.

Soft tissues: There is left pre maxillary soft tissue swelling.
There is no abscess. Multiple dental caries are noted.
IMPRESSION: 1. No acute intracranial abnormality.
2. There is left-sided pre maxillary soft tissue swelling without
evidence for an associated abscess.
3. Multiple dental caries are noted.
